# Patient Record
Sex: Female | Born: 1975
Health system: Southern US, Community
[De-identification: ages and names within clinical notes are randomized; demographics above are authoritative.]

## PROBLEM LIST (undated history)

## (undated) ENCOUNTER — Inpatient Hospital Stay (HOSPITAL_COMMUNITY): Payer: Self-pay

## (undated) DIAGNOSIS — Z789 Other specified health status: Secondary | ICD-10-CM

## (undated) DIAGNOSIS — T7840XA Allergy, unspecified, initial encounter: Secondary | ICD-10-CM

## (undated) DIAGNOSIS — Q828 Other specified congenital malformations of skin: Secondary | ICD-10-CM

## (undated) HISTORY — PX: CHOLECYSTECTOMY: SHX55

## (undated) HISTORY — PX: DILATION AND CURETTAGE OF UTERUS: SHX78

## (undated) HISTORY — DX: Allergy, unspecified, initial encounter: T78.40XA

---

## 1999-10-08 ENCOUNTER — Other Ambulatory Visit: Admission: RE | Admit: 1999-10-08 | Discharge: 1999-10-08 | Payer: Self-pay | Admitting: Gynecology

## 2001-09-18 ENCOUNTER — Emergency Department (HOSPITAL_COMMUNITY): Admission: EM | Admit: 2001-09-18 | Discharge: 2001-09-18 | Payer: Self-pay | Admitting: Emergency Medicine

## 2001-12-14 ENCOUNTER — Other Ambulatory Visit: Admission: RE | Admit: 2001-12-14 | Discharge: 2001-12-14 | Payer: Self-pay | Admitting: Obstetrics and Gynecology

## 2002-12-15 ENCOUNTER — Emergency Department (HOSPITAL_COMMUNITY): Admission: AD | Admit: 2002-12-15 | Discharge: 2002-12-15 | Payer: Self-pay | Admitting: Family Medicine

## 2003-02-24 ENCOUNTER — Encounter: Admission: RE | Admit: 2003-02-24 | Discharge: 2003-03-25 | Payer: Self-pay | Admitting: Unknown Physician Specialty

## 2003-09-25 ENCOUNTER — Emergency Department (HOSPITAL_COMMUNITY): Admission: EM | Admit: 2003-09-25 | Discharge: 2003-09-25 | Payer: Self-pay | Admitting: Family Medicine

## 2004-05-23 ENCOUNTER — Ambulatory Visit (HOSPITAL_COMMUNITY): Admission: RE | Admit: 2004-05-23 | Discharge: 2004-05-23 | Payer: Self-pay | Admitting: Obstetrics

## 2004-08-07 ENCOUNTER — Ambulatory Visit (HOSPITAL_COMMUNITY): Admission: RE | Admit: 2004-08-07 | Discharge: 2004-08-07 | Payer: Self-pay | Admitting: Obstetrics

## 2004-10-16 ENCOUNTER — Inpatient Hospital Stay (HOSPITAL_COMMUNITY): Admission: AD | Admit: 2004-10-16 | Discharge: 2004-10-17 | Payer: Self-pay | Admitting: Obstetrics & Gynecology

## 2004-12-23 ENCOUNTER — Inpatient Hospital Stay (HOSPITAL_COMMUNITY): Admission: AD | Admit: 2004-12-23 | Discharge: 2004-12-23 | Payer: Self-pay | Admitting: Obstetrics

## 2004-12-26 ENCOUNTER — Inpatient Hospital Stay (HOSPITAL_COMMUNITY): Admission: AD | Admit: 2004-12-26 | Discharge: 2004-12-26 | Payer: Self-pay | Admitting: Obstetrics

## 2004-12-26 ENCOUNTER — Inpatient Hospital Stay (HOSPITAL_COMMUNITY): Admission: AD | Admit: 2004-12-26 | Discharge: 2004-12-29 | Payer: Self-pay | Admitting: Obstetrics

## 2006-07-31 ENCOUNTER — Emergency Department (HOSPITAL_COMMUNITY): Admission: EM | Admit: 2006-07-31 | Discharge: 2006-07-31 | Payer: Self-pay | Admitting: Emergency Medicine

## 2006-10-02 ENCOUNTER — Inpatient Hospital Stay (HOSPITAL_COMMUNITY): Admission: AD | Admit: 2006-10-02 | Discharge: 2006-10-02 | Payer: Self-pay | Admitting: Obstetrics

## 2009-09-23 ENCOUNTER — Emergency Department (HOSPITAL_COMMUNITY): Admission: EM | Admit: 2009-09-23 | Discharge: 2009-09-23 | Payer: Self-pay | Admitting: Emergency Medicine

## 2010-05-04 LAB — POCT URINALYSIS DIPSTICK
Nitrite: POSITIVE — AB
Protein, ur: NEGATIVE mg/dL
Specific Gravity, Urine: >=1.03 (ref 1.005–1.030)
Urobilinogen, UA: 1 mg/dL (ref 0.0–1.0)
pH: 6.5 (ref 5.0–8.0)

## 2010-12-03 LAB — CBC
MCHC: 34.1
MCV: 83.3
RDW: 13.8
WBC: 6.3

## 2010-12-03 LAB — POCT PREGNANCY, URINE
Operator id: 13440
Preg Test, Ur: NEGATIVE

## 2010-12-03 LAB — WET PREP, GENITAL: Trich, Wet Prep: NONE SEEN

## 2010-12-03 LAB — GC/CHLAMYDIA PROBE AMP, GENITAL: GC Probe Amp, Genital: NEGATIVE

## 2010-12-03 LAB — URINALYSIS, ROUTINE W REFLEX MICROSCOPIC
Nitrite: NEGATIVE
Protein, ur: NEGATIVE
Urobilinogen, UA: 0.2

## 2010-12-03 LAB — URINE MICROSCOPIC-ADD ON

## 2011-07-19 ENCOUNTER — Emergency Department (HOSPITAL_COMMUNITY)
Admission: EM | Admit: 2011-07-19 | Discharge: 2011-07-19 | Disposition: A | Discharge status: Home / Self Care | Payer: PRIVATE HEALTH INSURANCE | Attending: Emergency Medicine | Admitting: Emergency Medicine

## 2011-07-19 ENCOUNTER — Encounter (HOSPITAL_COMMUNITY): Payer: Self-pay | Admitting: Emergency Medicine

## 2011-07-19 DIAGNOSIS — N390 Urinary tract infection, site not specified: Secondary | ICD-10-CM | POA: Insufficient documentation

## 2011-07-19 LAB — URINALYSIS, ROUTINE W REFLEX MICROSCOPIC
Bilirubin Urine: NEGATIVE
Urobilinogen, UA: 1 mg/dL (ref 0.0–1.0)
pH: 7.5 (ref 5.0–8.0)

## 2011-07-19 LAB — URINE MICROSCOPIC-ADD ON

## 2011-07-19 MED ORDER — SULFAMETHOXAZOLE-TMP DS 800-160 MG PO TABS: 1.0000 | ORAL_TABLET | Freq: Two times a day (BID) | ORAL | Status: AC

## 2011-07-19 MED ORDER — PHENAZOPYRIDINE HCL 200 MG PO TABS: 200.0000 mg | ORAL_TABLET | Freq: Two times a day (BID) | ORAL | Status: AC

## 2011-07-19 MED ORDER — SULFAMETHOXAZOLE-TMP DS 800-160 MG PO TABS
1.0000 | ORAL_TABLET | Freq: Once | ORAL | Status: AC
  Administered 2011-07-19: 1 via ORAL
  Filled 2011-07-19: qty 1

## 2011-07-19 MED ORDER — PHENAZOPYRIDINE HCL 200 MG PO TABS
200.0000 mg | ORAL_TABLET | Freq: Three times a day (TID) | ORAL | Status: DC
  Administered 2011-07-19: 200 mg via ORAL
  Filled 2011-07-19: qty 1

## 2011-07-19 NOTE — ED Notes (Signed)
Patient is alert and oriented x3.  She was given DC instructions and follow up visit instructions.  Patient gave verbal understanding. She was DC ambulatory under his own power to home.  V/S stable.  He was not showing any signs of distress on DC 

## 2011-07-19 NOTE — ED Notes (Signed)
Patient reports frequent urination and dysuria

## 2011-07-19 NOTE — ED Provider Notes (Signed)
Medical screening examination/treatment/procedure(s) were performed by non-physician practitioner and as supervising physician I was immediately available for consultation/collaboration.   Stephaine Breshears M Greer Wainright, MD 07/19/11 2244 

## 2011-07-19 NOTE — ED Provider Notes (Signed)
History     CSN: [id number]  Arrival date & time 07/19/11  0155   None     Chief Complaint  Patient presents with  . Urinary Tract Infection    (Consider location/radiation/quality/duration/timing/severity/associated sxs/prior treatment) HPI Comments: Q. the onset of urinary tract, dysuria.  Tonight.  She has a history of UTI several years ago.  Denies vaginal discharge concerned for an STD  Patient is a 36 y.o. female presenting with urinary tract infection. The history is provided by the patient.  Urinary Tract Infection This is a new problem. The current episode started today. The problem has been unchanged. Pertinent negatives include no fever, nausea or vomiting.    History reviewed. No pertinent past medical history.  Past Surgical History  Procedure Date  . Cholecystectomy     History reviewed. No pertinent family history.  History  Substance Use Topics  . Smoking status: Never Smoker   . Smokeless tobacco: Never Used  . Alcohol Use: 0.6 oz/week    1 Cans of beer per week     social    OB History    Grav Para Term Preterm Abortions TAB SAB Ect Mult Living                  Review of Systems  Constitutional: Negative for fever.  Gastrointestinal: Negative for nausea and vomiting.  Genitourinary: Positive for dysuria and frequency. Negative for flank pain, vaginal bleeding, vaginal discharge and vaginal pain.  Neurological: Negative for dizziness.    Allergies  Review of patient's allergies indicates no known allergies.  Home Medications   Current Outpatient Rx  Name Route Sig Dispense Refill  . NORGESTIMATE-ETH ESTRADIOL 0.25-35 MG-MCG PO TABS Oral Take 1 tablet by mouth daily.    Marland Kitchen PHENAZOPYRIDINE HCL 200 MG PO TABS Oral Take 1 tablet (200 mg total) by mouth 2 (two) times daily with a meal. 10 tablet 0  . SULFAMETHOXAZOLE-TMP DS 800-160 MG PO TABS Oral Take 1 tablet by mouth 2 (two) times daily. 9 tablet 0    BP 131/84  Pulse 79  Temp(Src) 98.4  F (36.9 C) (Oral)  Resp 16  SpO2 99%  LMP 07/10/2011  Physical Exam  Constitutional: She appears well-nourished.  HENT:  Head: Normocephalic.  Eyes: Pupils are equal, round, and reactive to light.  Cardiovascular: Normal rate.   Pulmonary/Chest: Effort normal.  Abdominal: She exhibits no distension. There is tenderness in the suprapubic area.  Musculoskeletal: Normal range of motion.  Neurological: She is alert.  Skin: Skin is warm.    ED Course  Procedures (including critical care time)  Labs Reviewed  URINALYSIS, ROUTINE W REFLEX MICROSCOPIC - Abnormal; Notable for the following:    APPearance CLOUDY (*)    Hgb urine dipstick LARGE (*)    Protein, ur 100 (*)    Leukocytes, UA LARGE (*)    All other components within normal limits  URINE MICROSCOPIC-ADD ON   No results found.   1. UTI (lower urinary tract infection)       MDM   Review of her urine sample.  If she does have a UTI        Arman Filter, NP 07/19/11 7829  Arman Filter, NP 07/19/11 [phone]

## 2011-07-19 NOTE — Discharge Instructions (Signed)
Urinary Tract Infection A urinary tract infection (UTI) is often caused by a germ (bacteria). A UTI is usually helped with medicine (antibiotics) that kills germs. Take all the medicine until it is gone. Do this even if you are feeling better. You are usually better in 7 to 10 days. HOME CARE   Drink enough water and fluids to keep your pee (urine) clear or pale yellow. Drink:   Cranberry juice.   Water.   Avoid:   Caffeine.   Tea.   Bubbly (carbonated) drinks.   Alcohol.   Only take medicine as told by your doctor.   To prevent further infections:   Pee often.   After pooping (bowel movement), women should wipe from front to back. Use each tissue only once.   Pee before and after having sex (intercourse).  Ask your doctor when your test results will be ready. Make sure you follow up and get your test results.  GET HELP RIGHT AWAY IF:   There is very bad back pain or lower belly (abdominal) pain.   You get the chills.   You have a fever.   Your baby is older than 3 months with a rectal temperature of 102 F (38.9 C) or higher.   Your baby is 3 months old or younger with a rectal temperature of 100.4 F (38 C) or higher.   You feel sick to your stomach (nauseous) or throw up (vomit).   There is continued burning with peeing.   Your problems are not better in 3 days. Return sooner if you are getting worse.  MAKE SURE YOU:   Understand these instructions.   Will watch your condition.   Will get help right away if you are not doing well or get worse.  Document Released: 07/24/2007 Document Revised: 01/24/2011 Document Reviewed: 07/24/2007 [employer] Patient Information 2012 [employer]. 

## 2011-10-11 ENCOUNTER — Encounter (HOSPITAL_COMMUNITY): Payer: Self-pay | Admitting: *Deleted

## 2011-10-11 ENCOUNTER — Emergency Department (INDEPENDENT_AMBULATORY_CARE_PROVIDER_SITE_OTHER)
Admission: EM | Admit: 2011-10-11 | Discharge: 2011-10-11 | Disposition: A | Discharge status: Home / Self Care | Payer: PRIVATE HEALTH INSURANCE | Source: Home / Self Care | Attending: Emergency Medicine | Admitting: Emergency Medicine

## 2011-10-11 DIAGNOSIS — L42 Pityriasis rosea: Secondary | ICD-10-CM

## 2011-10-11 MED ORDER — TRIAMCINOLONE ACETONIDE 0.025 % EX OINT: TOPICAL_OINTMENT | Freq: Two times a day (BID) | CUTANEOUS | Status: AC

## 2011-10-11 NOTE — ED Provider Notes (Signed)
History     CSN: [id number]  Arrival date & time 10/11/11  1349   First MD Initiated Contact with Patient 10/11/11 1407      Chief Complaint  Patient presents with  . Rash    (Consider location/radiation/quality/duration/timing/severity/associated sxs/prior treatment) HPI Comments: Patient presents this early afternoon to urgent care complaining of a rash in her upper torso back and her face for about 4 days. She noticed that this started on her chest area and has a bigger patch near her left breast tissue is somewhat itchy but not a lot. She suspected she might have used a new detergent. She denies any others symptoms such as fever, carotids, myalgias, unintentional weight loss or appetite changes. Describes it in the last couple days she has been observing more of this rash somewhat darker in the middle that have disseminated to her upper back and some upper arms and some on her face. No other family member has a similar rash.  Patient is a 36 y.o. female presenting with rash. The history is provided by the patient and the spouse.  Rash  This is a new problem. The current episode started 2 days ago. The problem has not changed since onset.The problem is associated with nothing. The rash is present on the torso, trunk and face. The pain is at a severity of 0/10. The patient is experiencing no pain. Associated symptoms include itching. Pertinent negatives include no pain and no weeping. She has tried nothing for the symptoms. The treatment provided no relief.    History reviewed. No pertinent past medical history.  Past Surgical History  Procedure Date  . Cholecystectomy     No family history on file.  History  Substance Use Topics  . Smoking status: Never Smoker   . Smokeless tobacco: Never Used  . Alcohol Use: 0.6 oz/week    1 Cans of beer per week     social    OB History    Grav Para Term Preterm Abortions TAB SAB Ect Mult Living                  Review of Systems    Constitutional: Negative for fever, chills, activity change, appetite change and unexpected weight change.  Skin: Positive for itching and rash. Negative for color change and wound.    Allergies  Review of patient's allergies indicates no known allergies.  Home Medications   Current Outpatient Rx  Name Route Sig Dispense Refill  . NORGESTIMATE-ETH ESTRADIOL 0.25-35 MG-MCG PO TABS Oral Take 1 tablet by mouth daily.    . TRIAMCINOLONE ACETONIDE 0.025 % EX OINT Topical Apply topically 2 (two) times daily. Apply bid x 2 weeks 30 g 0    BP 143/82  Pulse 81  Temp 98.1 F (36.7 C) (Oral)  Resp 18  SpO2 100%  LMP 10/02/2011  Physical Exam  Nursing note and vitals reviewed. Constitutional: Vital signs are normal. She appears well-developed and well-nourished.  Non-toxic appearance. She does not have a sickly appearance. She does not appear ill. No distress.  Skin: Rash noted. There is erythema.       ED Course  Procedures (including critical care time)  Labs Reviewed - No data to display No results found.   1. Pityriasis rosea       MDM  Multiple lesions the upper torso neck area and arm consistent with PPS his rosea. We discussed diagnosis and symptomatic management of mild pruritus with triamcinolone cream. Patient agreed understood discharge instructions.  Jimmie Molly, MD 10/11/11 2059

## 2011-10-11 NOTE — ED Notes (Signed)
PT  EXHIBITS  SYMPTOMS  OF  A  RASH ON TORSO  AND  BACK  SYMPTOMS  X  4  DAYS    UNRELEIVED  BY OTC  MEDS   RECENT  NEW  DETERGENT    -  NO  ANGIOEDEMA   NO  SEVERE  DISTRESS

## 2012-01-31 ENCOUNTER — Emergency Department (INDEPENDENT_AMBULATORY_CARE_PROVIDER_SITE_OTHER)
Admission: EM | Admit: 2012-01-31 | Discharge: 2012-01-31 | Disposition: A | Discharge status: Home / Self Care | Payer: Self-pay | Source: Home / Self Care

## 2012-01-31 ENCOUNTER — Encounter (HOSPITAL_COMMUNITY): Payer: Self-pay

## 2012-01-31 DIAGNOSIS — J029 Acute pharyngitis, unspecified: Secondary | ICD-10-CM

## 2012-01-31 DIAGNOSIS — J069 Acute upper respiratory infection, unspecified: Secondary | ICD-10-CM

## 2012-01-31 LAB — POCT RAPID STREP A: Streptococcus, Group A Screen (Direct): NEGATIVE

## 2012-01-31 MED ORDER — ACETAMINOPHEN 325 MG PO TABS: 650.0000 mg | ORAL_TABLET | Freq: Four times a day (QID) | ORAL | Status: DC | PRN

## 2012-01-31 MED ORDER — AMOXICILLIN 500 MG PO CAPS: 500.0000 mg | ORAL_CAPSULE | Freq: Three times a day (TID) | ORAL | Status: DC

## 2012-01-31 NOTE — ED Notes (Signed)
C/o sore throat x2days Patient states she was running a temperature today

## 2012-01-31 NOTE — ED Provider Notes (Signed)
History     CSN: [id number]  Arrival date & time 01/31/12  4098   First MD Initiated Contact with Patient 01/31/12 1849      Chief Complaint  Patient presents with  . Sore Throat    (Consider location/radiation/quality/duration/timing/severity/associated sxs/prior treatment) HPI Middle aged female here for sore throat since past 2-3 days. She informs into swallowing and fever off 100.6 yesterday. Denies any cough. Has occasional runny nose. Denies any headache or chills. Denies similar symptoms in the past. Denies any sick contacts. Denies chest pain, palpitations, shortness of breath. Denies abdominal pain bowel or urinary symptoms History reviewed. No pertinent past medical history.  Past Surgical History  Procedure Date  . Cholecystectomy     No family history on file.  History  Substance Use Topics  . Smoking status: Never Smoker   . Smokeless tobacco: Never Used  . Alcohol Use: 0.6 oz/week    1 Cans of beer per week     Comment: social    OB History    Grav Para Term Preterm Abortions TAB SAB Ect Mult Living                  Review of Systems As outlined in history of present illness Allergies  Review of patient's allergies indicates no known allergies.  Home Medications   Current Outpatient Rx  Name  Route  Sig  Dispense  Refill  . AMOXICILLIN 500 MG PO CAPS   Oral   Take 1 capsule (500 mg total) by mouth 3 (three) times daily.   30 capsule   0   . NORGESTIMATE-ETH ESTRADIOL 0.25-35 MG-MCG PO TABS   Oral   Take 1 tablet by mouth daily.         . TRIAMCINOLONE ACETONIDE 0.025 % EX OINT   Topical   Apply topically 2 (two) times daily. Apply bid x 2 weeks   30 g   0     BP 124/76  Pulse 104  Temp 100.6 F (38.1 C) (Oral)  Resp 19  SpO2 100%  Physical Exam Middle aged female in no acute distress HEENT no pallor, no icterus, moist oral mucosa, exudates over left tonsil. Tender cervical lymph node CVS: Normal S1-S2 no murmurs Chest:  Clear to this bilaterally no added sounds abdomen: Soft, nontender nondistended bowel sounds present Extremities: Warm, no edema ED Course  Procedures (including critical care time)   Labs Reviewed  RAPID STREP SCREEN   No results found.   1. Tonsillopharyngitis     i will prescribe her a course of amoxicillin 500 mg 3 times a day for 10 days. Patient advised on taking plenty of warm water, steam inhalation and salt water gargle to alleviate her symptoms. She should also take as needed Tylenol  for pain. Recommended to return to the ED or to  clinic if symptoms not improved or worsen in 24-48 hour   MDM  Follow up as needed        Eddie North, MD 01/31/12 1191

## 2012-07-12 ENCOUNTER — Other Ambulatory Visit: Payer: Self-pay | Admitting: Obstetrics

## 2012-08-13 ENCOUNTER — Encounter (HOSPITAL_COMMUNITY): Payer: Self-pay | Admitting: *Deleted

## 2012-08-13 ENCOUNTER — Emergency Department (INDEPENDENT_AMBULATORY_CARE_PROVIDER_SITE_OTHER)
Admission: EM | Admit: 2012-08-13 | Discharge: 2012-08-13 | Disposition: A | Discharge status: Home / Self Care | Payer: Self-pay | Source: Home / Self Care | Attending: Emergency Medicine | Admitting: Emergency Medicine

## 2012-08-13 DIAGNOSIS — J020 Streptococcal pharyngitis: Secondary | ICD-10-CM

## 2012-08-13 LAB — POCT RAPID STREP A: Streptococcus, Group A Screen (Direct): POSITIVE — AB

## 2012-08-13 MED ORDER — AMOXICILLIN 500 MG PO CAPS: 500.0000 mg | ORAL_CAPSULE | Freq: Three times a day (TID) | ORAL | Status: AC

## 2012-08-13 MED ORDER — ACETAMINOPHEN 325 MG PO TABS
975.0000 mg | ORAL_TABLET | Freq: Once | ORAL | Status: AC
  Administered 2012-08-13: 975 mg via ORAL

## 2012-08-13 MED ORDER — ACETAMINOPHEN 325 MG PO TABS
ORAL_TABLET | ORAL | Status: AC
  Filled 2012-08-13: qty 3

## 2012-08-13 NOTE — ED Provider Notes (Signed)
History    CSN: [id number] Arrival date & time 08/13/12  1842  First MD Initiated Contact with Patient 08/13/12 1912     Chief Complaint  Patient presents with  . Fever   (Consider location/radiation/quality/duration/timing/severity/associated sxs/prior Treatment) HPI Comments: Patient presents urgent care complaining of a sore throat and fever that started about 2 days ago. Denies any cough nasal congestion or shortness of breath. Her son has been sick with recent symptoms but mainly gastrointestinal symptoms but in the last 2 days his voice has changed and is worse now. She feels somewhat tired and with no appetite.  Patient is a 37 y.o. female presenting with fever. The history is provided by the patient.  Fever Temp source:  Tactile and oral Severity:  Moderate Onset quality:  Sudden Duration:  2 days Progression:  Worsening Associated symptoms: sore throat   Associated symptoms: no chest pain, no chills, no cough, no ear pain, no nausea, no rash, no rhinorrhea, no somnolence and no vomiting   Risk factors: no immunosuppression, no recent surgery and no sick contacts    History reviewed. No pertinent past medical history. Past Surgical History  Procedure Laterality Date  . Cholecystectomy     Family History  Problem Relation Age of Onset  . Diabetes Mother   . Hypertension Mother   . Thyroid disease Mother   . Colon cancer Father    History  Substance Use Topics  . Smoking status: Never Smoker   . Smokeless tobacco: Never Used  . Alcohol Use: 0.6 oz/week    1 Cans of beer per week     Comment: social   OB History   Grav Para Term Preterm Abortions TAB SAB Ect Mult Living                 Review of Systems  Constitutional: Positive for fever and appetite change. Negative for chills, activity change and unexpected weight change.  HENT: Positive for sore throat. Negative for ear pain, rhinorrhea, trouble swallowing and voice change.   Respiratory: Negative for  cough.   Cardiovascular: Negative for chest pain.  Gastrointestinal: Negative for nausea and vomiting.  Skin: Negative for color change, pallor and rash.    Allergies  Review of patient's allergies indicates no known allergies.  Home Medications   Current Outpatient Rx  Name  Route  Sig  Dispense  Refill  . acetaminophen (TYLENOL) 325 MG tablet   Oral   Take 2 tablets (650 mg total) by mouth every 6 (six) hours as needed for pain or fever.   30 tablet   0   . SPRINTEC 28 0.25-35 MG-MCG tablet      TAKE ONE TABLET BY MOUTH DAILY AS DIRECTED   2 Package   5   . amoxicillin (AMOXIL) 500 MG capsule   Oral   Take 1 capsule (500 mg total) by mouth 3 (three) times daily.   30 capsule   0   . amoxicillin (AMOXIL) 500 MG capsule   Oral   Take 1 capsule (500 mg total) by mouth 3 (three) times daily.   30 capsule   0   . triamcinolone (KENALOG) 0.025 % ointment   Topical   Apply topically 2 (two) times daily. Apply bid x 2 weeks   30 g   0    BP 126/74  Pulse 110  Temp(Src) 102.7 F (39.3 C) (Oral)  Resp 22  SpO2 100%  LMP 07/23/2012 Physical Exam  Vitals reviewed. Constitutional: Vital  signs are normal. She appears well-developed and well-nourished.  Non-toxic appearance. She does not have a sickly appearance. She does not appear ill.  HENT:  Head: Normocephalic.  Mouth/Throat: Uvula is midline. Oropharyngeal exudate and posterior oropharyngeal erythema present. No tonsillar abscesses.  Eyes: Conjunctivae are normal. Right eye exhibits no discharge. No scleral icterus.  Neck: Trachea normal. Neck supple.  Pulmonary/Chest: Effort normal and breath sounds normal.  Lymphadenopathy:    She has cervical adenopathy.  Skin: No erythema.    ED Course  Procedures (including critical care time) Labs Reviewed  POCT RAPID STREP A ([facility] ONLY) - Abnormal; Notable for the following:    Streptococcus, Group A Screen (Direct) POSITIVE (*)    All other components  within normal limits   No results found. 1. Strep pharyngitis     MDM  Uncomplicated streptococcal pharyngitis. Patient has been started on a course of 10 days of amoxicillin. Tylenol and ibuprofen for pain and fever management. Work note was provided for the next 48 hours and you areas of the colon are equal a always a the eyes the head was is a lymph node a small is a please is a New Prescriptions   AMOXICILLIN (AMOXIL) 500 MG CAPSULE    Take 1 capsule (500 mg total) by mouth 3 (three) times daily.    Jimmie Molly, MD 08/13/12 2006

## 2012-08-13 NOTE — ED Notes (Addendum)
Sorethroat onset Tues. and woke up last night with chills and fever.  Went to work today.  She thinks her glands are swollen and it hurts to turn her head.

## 2012-09-10 ENCOUNTER — Ambulatory Visit: Payer: Self-pay | Admitting: Obstetrics

## 2012-11-24 ENCOUNTER — Other Ambulatory Visit: Payer: Self-pay | Admitting: *Deleted

## 2012-11-24 DIAGNOSIS — B9689 Other specified bacterial agents as the cause of diseases classified elsewhere: Secondary | ICD-10-CM

## 2012-11-24 DIAGNOSIS — N760 Acute vaginitis: Secondary | ICD-10-CM

## 2012-11-24 MED ORDER — METRONIDAZOLE 500 MG PO TABS: 500.0000 mg | ORAL_TABLET | Freq: Two times a day (BID) | ORAL | Status: DC

## 2013-04-01 ENCOUNTER — Encounter: Payer: Self-pay | Admitting: Obstetrics

## 2013-04-01 ENCOUNTER — Ambulatory Visit (INDEPENDENT_AMBULATORY_CARE_PROVIDER_SITE_OTHER): Payer: Medicaid Other | Admitting: Obstetrics

## 2013-04-01 ENCOUNTER — Ambulatory Visit: Payer: Self-pay | Admitting: Obstetrics

## 2013-04-01 VITALS — BP 123/83 | HR 72 | Temp 97.9°F | Ht 63.0 in | Wt 153.0 lb

## 2013-04-01 DIAGNOSIS — Z113 Encounter for screening for infections with a predominantly sexual mode of transmission: Secondary | ICD-10-CM

## 2013-04-01 DIAGNOSIS — Z124 Encounter for screening for malignant neoplasm of cervix: Secondary | ICD-10-CM

## 2013-04-01 DIAGNOSIS — Z3009 Encounter for other general counseling and advice on contraception: Secondary | ICD-10-CM

## 2013-04-01 DIAGNOSIS — Z0000 Encounter for general adult medical examination without abnormal findings: Secondary | ICD-10-CM

## 2013-04-01 NOTE — Progress Notes (Signed)
Subjective:     Sonya Butler is a 38 y.o. female here for a routine exam.  Current complaints: Patient in office today for an annual exam.Patient states she is having some back pain. Patient states she had a TAB on February 27, 2013 Patient states she had some spotting after, then one week it was like she had a regular cycle then it was off and then came back the next week. Patient would like to talk about alternative birth control.   Personal health questionnaire reviewed: yes.   Gynecologic History No LMP recorded. Contraception: OCP (estrogen/progesterone) Last Pap: 2013. Results were: normal  Obstetric History OB History  Gravida Para Term Preterm AB SAB TAB Ectopic Multiple Living  3 2 2  1  1   2     # Outcome Date GA Lbr Len/2nd Weight Sex Delivery Anes PTL Lv  3 TRM 12/27/04 [id number]  7 lb 4 oz (3.289 kg) M SVD EPI  Y  2 TRM 05/03/92   7 lb 9 oz (3.43 kg) M SVD EPI  Y  1 TAB         N       The following portions of the patient's history were reviewed and updated as appropriate: allergies, current medications, past family history, past medical history, past social history, past surgical history and problem list.  Review of Systems Pertinent items are noted in HPI.    Objective:    General appearance: alert and no distress Breasts: normal appearance, no masses or tenderness Abdomen: normal findings: soft, non-tender Pelvic: cervix normal in appearance, external genitalia normal, no adnexal masses or tenderness, no cervical motion tenderness, rectovaginal septum normal, uterus normal size, shape, and consistency and vagina normal without discharge    Assessment:    Healthy female exam.   Contraceptive counseling.   Plan:      Nexplanon Rx.    F/U 1 year.

## 2013-04-02 LAB — WET PREP BY MOLECULAR PROBE
Candida species: NEGATIVE
Gardnerella vaginalis: POSITIVE — AB
Trichomonas vaginosis: NEGATIVE

## 2013-04-02 LAB — GC/CHLAMYDIA PROBE AMP
CT PROBE, AMP APTIMA: NEGATIVE
GC Probe RNA: NEGATIVE

## 2013-04-02 LAB — PAP IG W/ RFLX HPV ASCU

## 2013-04-08 ENCOUNTER — Encounter: Payer: Self-pay | Admitting: Obstetrics

## 2013-04-08 ENCOUNTER — Ambulatory Visit (INDEPENDENT_AMBULATORY_CARE_PROVIDER_SITE_OTHER): Payer: Medicaid Other | Admitting: Obstetrics

## 2013-04-08 VITALS — BP 126/77 | HR 87 | Temp 97.7°F | Ht 63.0 in | Wt 154.0 lb

## 2013-04-08 DIAGNOSIS — Z3202 Encounter for pregnancy test, result negative: Secondary | ICD-10-CM

## 2013-04-08 DIAGNOSIS — A499 Bacterial infection, unspecified: Secondary | ICD-10-CM

## 2013-04-08 DIAGNOSIS — B9689 Other specified bacterial agents as the cause of diseases classified elsewhere: Secondary | ICD-10-CM

## 2013-04-08 DIAGNOSIS — IMO0001 Reserved for inherently not codable concepts without codable children: Secondary | ICD-10-CM

## 2013-04-08 DIAGNOSIS — Z30017 Encounter for initial prescription of implantable subdermal contraceptive: Secondary | ICD-10-CM

## 2013-04-08 DIAGNOSIS — N760 Acute vaginitis: Secondary | ICD-10-CM

## 2013-04-08 LAB — POCT URINE PREGNANCY: Preg Test, Ur: NEGATIVE

## 2013-04-08 MED ORDER — ETONOGESTREL 68 MG ~~LOC~~ IMPL
68.0000 mg | DRUG_IMPLANT | Freq: Once | SUBCUTANEOUS | Status: AC
  Administered 2013-04-08: 68 mg via SUBCUTANEOUS

## 2013-04-08 MED ORDER — METRONIDAZOLE 500 MG PO TABS: 500.0000 mg | ORAL_TABLET | Freq: Two times a day (BID) | ORAL | Status: DC

## 2013-04-08 NOTE — Progress Notes (Addendum)
NEXPLANON INSERTION NOTE  Date of LMP:   Not asked.  Contraception used: Abstinence  Pregnancy test result: Negative  Indications:  The patient desires contraception.  She understands risks, benefits, and alternatives to Implanon and would like to proceed.  Anesthesia:   Lidocaine 1% plain.  Procedure:  A time-out was performed confirming the procedure and the patient's allergy status.  The patient's non-dominant was identified as the left arm.  The protection cap was removed. While placing countertraction on the skin, the needle was inserted at a 30 degree angle.  The applicator was held horizontal to the skin; the skin was tented upward as the needle was introduced into the subdermal space.  While holding the applicator in place, the slider was unlocked. The Nexplanon was removed from the field.  The Nexplanon was palpated to ensure proper placement.  Complications: None  Instructions:  The patient was instructed to remove the dressing in 24 hours and that some bruising is to be expected.  She was advised to use over the counter analgesics as needed for any pain at the site.  She is to keep the area dry for 24 hours and to call if her hand or arm becomes cold, numb, or blue.  Return visit:  Return in 2 weeks  EXP:  07 / 2017 Lot # :  [id number] / [id number]

## 2013-04-09 ENCOUNTER — Encounter: Payer: Self-pay | Admitting: Obstetrics

## 2013-04-22 ENCOUNTER — Encounter: Payer: Self-pay | Admitting: Obstetrics

## 2013-04-22 ENCOUNTER — Ambulatory Visit (INDEPENDENT_AMBULATORY_CARE_PROVIDER_SITE_OTHER): Payer: Medicaid Other | Admitting: Obstetrics

## 2013-04-22 VITALS — BP 125/82 | HR 73 | Wt 148.0 lb

## 2013-04-22 DIAGNOSIS — Z3046 Encounter for surveillance of implantable subdermal contraceptive: Secondary | ICD-10-CM

## 2013-04-22 NOTE — Progress Notes (Signed)
Subjective:     [name] is a 38 y.o. female here for a follow up Nexplanon insertion exam.  Current complaints: pt states that incision site is a little itchy but has no other complaints..  Personal health questionnaire reviewed: yes.   Gynecologic History No LMP recorded. Contraception: Nexplanon   Obstetric History OB History  Gravida Para Term Preterm AB SAB TAB Ectopic Multiple Living  3 2 2  1  1   2     # Outcome Date GA Lbr Len/2nd Weight Sex Delivery Anes PTL Lv  3 TAB 2015        N  2 TRM 12/27/04 [id number]  7 lb 4 oz (3.289 kg) M SVD EPI  Y  1 TRM 05/03/92   7 lb 9 oz (3.43 kg) M SVD EPI  Y       The following portions of the patient's history were reviewed and updated as appropriate: allergies, current medications, past family history, past medical history, past social history, past surgical history and problem list.  Review of Systems Pertinent items are noted in HPI.    Objective:     LUE:  Nexplanon rod palpated.  Intact and nontender to palpation.     Assessment:    Nexplanon surveillance.  Doing well.   Plan:    Education reviewed: safe sex/STD prevention. Contraception: Nexplanon. Annual next visit.

## 2013-07-26 ENCOUNTER — Other Ambulatory Visit: Payer: Self-pay | Admitting: *Deleted

## 2013-07-26 DIAGNOSIS — B379 Candidiasis, unspecified: Secondary | ICD-10-CM

## 2013-07-26 MED ORDER — FLUCONAZOLE 150 MG PO TABS: 150.0000 mg | ORAL_TABLET | Freq: Once | ORAL | Status: DC

## 2013-07-27 ENCOUNTER — Telehealth: Payer: Self-pay | Admitting: *Deleted

## 2013-07-27 NOTE — Telephone Encounter (Signed)
Patient called wanting a prescription change. Patient states she took medication for her BV and that it has been 3 days since finishing the antibiotic. Patient states she is still having symptoms, a thick white discharge and a little vaginal odor. Patient advised that it sounds like she has a yeast infection and per Nursing protocol we would send Diflucan to the Pharmacy. Patient advised to give the medication a week and that if symptoms had not improved to call us back and we would get her in for an appointment.

## 2013-07-30 ENCOUNTER — Telehealth: Payer: Self-pay | Admitting: *Deleted

## 2013-07-30 NOTE — Telephone Encounter (Signed)
Patient states she has been treated with FLagyl and Diflucan and is still having vaginal discharge. Patient scheduled an appointment for 08/03/13 at 1:45p.

## 2013-08-03 ENCOUNTER — Ambulatory Visit (INDEPENDENT_AMBULATORY_CARE_PROVIDER_SITE_OTHER): Payer: Medicaid Other | Admitting: Advanced Practice Midwife

## 2013-08-03 ENCOUNTER — Ambulatory Visit: Payer: Self-pay | Admitting: Obstetrics

## 2013-08-03 ENCOUNTER — Encounter: Payer: Self-pay | Admitting: Obstetrics

## 2013-08-03 VITALS — BP 126/80 | HR 87 | Temp 98.4°F | Ht 63.0 in | Wt 148.0 lb

## 2013-08-03 DIAGNOSIS — N760 Acute vaginitis: Secondary | ICD-10-CM

## 2013-08-03 DIAGNOSIS — A499 Bacterial infection, unspecified: Secondary | ICD-10-CM

## 2013-08-03 DIAGNOSIS — B9689 Other specified bacterial agents as the cause of diseases classified elsewhere: Secondary | ICD-10-CM

## 2013-08-03 MED ORDER — METRONIDAZOLE 500 MG PO TABS: 500.0000 mg | ORAL_TABLET | Freq: Two times a day (BID) | ORAL | Status: DC

## 2013-08-03 NOTE — Progress Notes (Signed)
  Subjective:    Sonya Butler is a 38 y.o. female who presents for vaginal discharge and sexually transmitted disease check. Sexual history reviewed with the patient. STI Exposure: denies knowledge of risky exposure. Previous history of STI not mentioned. Current symptoms vaginal discharge: malodorous. Patient believes she has +BV, reports she gets 3-4 infections annual approx. States she did recently douche due to the odor.  Contraception: Nexplanon Menstrual History: OB History   Grav Para Term Preterm Abortions TAB SAB Ect Mult Living   3 2 2  1 1    2       Menarche age: [age]  Patient's last menstrual period was 07/29/2013.    The following portions of the patient's history were reviewed and updated as appropriate: allergies, current medications, past family history, past medical history, past social history, past surgical history and problem list.  Review of Systems Constitutional: negative for fatigue and weight loss Respiratory: negative for cough and wheezing Cardiovascular: negative for chest pain, fatigue and palpitations Gastrointestinal: negative for abdominal pain and change in bowel habits Genitourinary:vaginal discharge w/ odor Integument/breast: negative for nipple discharge Musculoskeletal:negative for myalgias Neurological: negative for gait problems and tremors Behavioral/Psych: negative for abusive relationship, depression Endocrine: negative for temperature intolerance       Objective:    BP 126/80  Pulse 87  Temp(Src) 98.4 F (36.9 C)  Ht 5\' 3"  (1.6 m)  Wt 148 lb (67.132 kg)  BMI 26.22 kg/m2  LMP 07/29/2013 General:   alert and cooperative  Lymph Nodes:   Cervical, supraclavicular, and axillary nodes normal.  Pelvis:  Vulva and vagina appear normal. Bimanual exam reveals normal uterus and adnexa. Vaginal: discharge, +whiff  Cultures:  GC and Chlamydia genprobes and affirm     Assessment:    Very low risk of STD exposure Bacterial Vaginosis  (recurrent)      Plan:    Discussed safe sexual practice in detail Flagyl at this time, if patient has another infection consider long term management Encouraged probiotic, and discussed methods to reduce risk of reoccurrence. Patient declined full STI screen aware it is recommended when getting a STI screen. Orders Placed This Encounter  Procedures  . WET PREP BY MOLECULAR PROBE  . GC/Chlamydia Probe Amp   Meds ordered this encounter  Medications  . metroNIDAZOLE (FLAGYL) 500 MG tablet    Sig: Take 1 tablet (500 mg total) by mouth 2 (two) times daily.    Dispense:  14 tablet    Refill:  0    Order Specific Question:  Supervising Provider    Answer:  Coral CeoHARPER, CHARLES A [[id number]     Dory HornAmy Wren CNM

## 2013-08-04 LAB — GC/CHLAMYDIA PROBE AMP
CT Probe RNA: NEGATIVE
GC Probe RNA: NEGATIVE

## 2013-08-04 LAB — WET PREP BY MOLECULAR PROBE
Candida species: NEGATIVE
Gardnerella vaginalis: POSITIVE — AB
TRICHOMONAS VAG: NEGATIVE

## 2013-08-22 ENCOUNTER — Inpatient Hospital Stay (HOSPITAL_COMMUNITY)
Admission: AD | Admit: 2013-08-22 | Discharge: 2013-08-22 | Disposition: A | Discharge status: Home / Self Care | Payer: Medicaid Other | Source: Ambulatory Visit | Attending: Obstetrics | Admitting: Obstetrics

## 2013-08-22 ENCOUNTER — Encounter (HOSPITAL_COMMUNITY): Payer: Self-pay

## 2013-08-22 DIAGNOSIS — B3731 Acute candidiasis of vulva and vagina: Secondary | ICD-10-CM | POA: Diagnosis not present

## 2013-08-22 DIAGNOSIS — B373 Candidiasis of vulva and vagina: Secondary | ICD-10-CM | POA: Diagnosis not present

## 2013-08-22 DIAGNOSIS — R300 Dysuria: Secondary | ICD-10-CM | POA: Diagnosis present

## 2013-08-22 HISTORY — DX: Other specified health status: Z78.9

## 2013-08-22 LAB — URINALYSIS, ROUTINE W REFLEX MICROSCOPIC
BILIRUBIN URINE: NEGATIVE
GLUCOSE, UA: NEGATIVE mg/dL
KETONES UR: NEGATIVE mg/dL
Nitrite: NEGATIVE
PH: 5.5 (ref 5.0–8.0)
Protein, ur: NEGATIVE mg/dL
Specific Gravity, Urine: <1.005 — ABNORMAL LOW (ref 1.005–1.030)
Urobilinogen, UA: 0.2 mg/dL (ref 0.0–1.0)

## 2013-08-22 LAB — URINE MICROSCOPIC-ADD ON

## 2013-08-22 LAB — WET PREP, GENITAL
CLUE CELLS WET PREP: NONE SEEN
Trich, Wet Prep: NONE SEEN
Yeast Wet Prep HPF POC: NONE SEEN

## 2013-08-22 LAB — POCT PREGNANCY, URINE: Preg Test, Ur: NEGATIVE

## 2013-08-22 MED ORDER — FLUCONAZOLE 150 MG PO TABS
150.0000 mg | ORAL_TABLET | Freq: Once | ORAL | Status: AC
  Administered 2013-08-22: 150 mg via ORAL
  Filled 2013-08-22: qty 1

## 2013-08-22 NOTE — MAU Provider Note (Signed)
Attestation of Attending Supervision of Advanced Practitioner (CNM/NP): Evaluation and management procedures were performed by the Advanced Practitioner under my supervision and collaboration.  I have reviewed the Advanced Practitioner's note and chart, and I agree with the management and plan.  Gudelia Eugene 08/22/2013 8:34 PM   

## 2013-08-22 NOTE — Discharge Instructions (Signed)

## 2013-08-22 NOTE — MAU Provider Note (Signed)
History     CSN: [id number]  Arrival date and time: 08/22/13 1305   First Provider Initiated Contact with Patient 08/22/13 1519      Chief Complaint  Patient presents with  . Urinary Tract Infection  . Abdominal Pain   Urinary Tract Infection   Abdominal Pain    Sonya Butler is a 38 y.o. [id number] who presents today with pain and pressure when urinating. She states that this started this morning. She denies any vaginal discharge.   Past Medical History  Diagnosis Date  . Medical history non-contributory     Past Surgical History  Procedure Laterality Date  . Cholecystectomy    . Dilation and curettage of uterus      Family History  Problem Relation Age of Onset  . Diabetes Mother   . Hypertension Mother   . Thyroid disease Mother   . Colon cancer Father     History  Substance Use Topics  . Smoking status: Never Smoker   . Smokeless tobacco: Never Used  . Alcohol Use: No     Comment: social    Allergies: No Known Allergies  No prescriptions prior to admission    Review of Systems  Gastrointestinal: Positive for abdominal pain.   Physical Exam   Blood pressure 118/68, pulse 82, temperature 98.3 F (36.8 C), temperature source Oral, resp. rate 16, height 5\' 3"  (1.6 m), weight 67.132 kg (148 lb), last menstrual period 07/29/2013.  Physical Exam  Nursing note and vitals reviewed. Constitutional: She is oriented to person, place, and time. She appears well-developed and well-nourished. No distress.  Cardiovascular: Normal rate.   Respiratory: Effort normal.  GI: Soft. There is no tenderness.  Genitourinary:   External: no lesion Vagina: small amount of thick white discharge Cervix: pink, smooth, no CMT Uterus: NSSC Adnexa: NT   Neurological: She is alert and oriented to person, place, and time.  Skin: Skin is warm and dry.  Psychiatric: She has a normal mood and affect.    MAU Course  Procedures  Results for orders placed during the  hospital encounter of 08/22/13 (from the past 24 hour(s))  URINALYSIS, ROUTINE W REFLEX MICROSCOPIC     Status: Abnormal   Collection Time    08/22/13  2:30 PM      Result Value Ref Range   Color, Urine YELLOW  YELLOW   APPearance CLEAR  CLEAR   Specific Gravity, Urine <1.005 (*) 1.005 - 1.030   pH 5.5  5.0 - 8.0   Glucose, UA NEGATIVE  NEGATIVE mg/dL   Hgb urine dipstick TRACE (*) NEGATIVE   Bilirubin Urine NEGATIVE  NEGATIVE   Ketones, ur NEGATIVE  NEGATIVE mg/dL   Protein, ur NEGATIVE  NEGATIVE mg/dL   Urobilinogen, UA 0.2  0.0 - 1.0 mg/dL   Nitrite NEGATIVE  NEGATIVE   Leukocytes, UA SMALL (*) NEGATIVE  URINE MICROSCOPIC-ADD ON     Status: Abnormal   Collection Time    08/22/13  2:30 PM      Result Value Ref Range   Squamous Epithelial / LPF FEW (*) RARE   WBC, UA 7-10  <3 WBC/hpf   RBC / HPF 0-2  <3 RBC/hpf   Bacteria, UA FEW (*) RARE  POCT PREGNANCY, URINE     Status: None   Collection Time    08/22/13  2:36 PM      Result Value Ref Range   Preg Test, Ur NEGATIVE  NEGATIVE  WET PREP, GENITAL  Status: Abnormal   Collection Time    08/22/13  3:26 PM      Result Value Ref Range   Yeast Wet Prep HPF POC NONE SEEN  NONE SEEN   Trich, Wet Prep NONE SEEN  NONE SEEN   Clue Cells Wet Prep HPF POC NONE SEEN  NONE SEEN   WBC, Wet Prep HPF POC FEW (*) NONE SEEN    Assessment and Plan   1. Yeast infection involving the vagina and surrounding area    GC/CT pending UC pending Treated with diflucan here in MAU  Return to MAU as needed   Follow-up Information   [facility] [place]. (As needed)    Contact information:   [address] [place] [address] [phone]       Tawnya CrookHogan, Heather Donovan 08/22/2013, 3:29 PM

## 2013-08-22 NOTE — MAU Note (Signed)
Pt states here for urgency with voiding and suprapubic pain since this am. Denies bleeding or vag d/c changes.

## 2013-08-23 LAB — GC/CHLAMYDIA PROBE AMP
CT Probe RNA: NEGATIVE
GC PROBE AMP APTIMA: NEGATIVE

## 2013-08-23 LAB — URINE CULTURE

## 2013-09-07 ENCOUNTER — Ambulatory Visit (INDEPENDENT_AMBULATORY_CARE_PROVIDER_SITE_OTHER): Payer: Medicaid Other | Admitting: Obstetrics

## 2013-09-07 ENCOUNTER — Encounter: Payer: Self-pay | Admitting: Obstetrics

## 2013-09-07 VITALS — BP 121/82 | HR 83 | Temp 97.9°F | Ht 63.0 in | Wt 149.0 lb

## 2013-09-07 DIAGNOSIS — R350 Frequency of micturition: Secondary | ICD-10-CM

## 2013-09-07 DIAGNOSIS — R300 Dysuria: Secondary | ICD-10-CM

## 2013-09-08 LAB — URINE CULTURE

## 2013-09-09 ENCOUNTER — Encounter: Payer: Self-pay | Admitting: Obstetrics

## 2013-09-09 NOTE — Progress Notes (Signed)
Patient ID: Sonya Butler, female   DOB: [birth year], 38 y.o.   MRN: [id number]  Chief Complaint  Patient presents with  . Problem    Possible UTI     HPI Sonya Butler is a 38 y.o. female.  Urinary frequency and burning.  HPI  Past Medical History  Diagnosis Date  . Medical history non-contributory     Past Surgical History  Procedure Laterality Date  . Cholecystectomy    . Dilation and curettage of uterus      Family History  Problem Relation Age of Onset  . Diabetes Mother   . Hypertension Mother   . Thyroid disease Mother   . Colon cancer Father     Social History History  Substance Use Topics  . Smoking status: Never Smoker   . Smokeless tobacco: Never Used  . Alcohol Use: No     Comment: social    No Known Allergies  No current outpatient prescriptions on file.   No current facility-administered medications for this visit.    Review of Systems Review of Systems Constitutional: negative for fatigue and weight loss Respiratory: negative for cough and wheezing Cardiovascular: negative for chest pain, fatigue and palpitations Gastrointestinal: negative for abdominal pain and change in bowel habits Genitourinary:negative Integument/breast: negative for nipple discharge Musculoskeletal:negative for myalgias Neurological: negative for gait problems and tremors Behavioral/Psych: negative for abusive relationship, depression Endocrine: negative for temperature intolerance     Blood pressure 121/82, pulse 83, temperature 97.9 F (36.6 C), height 5\' 3"  (1.6 m), weight 149 lb (67.586 kg), last menstrual period 08/31/2013.  Physical Exam Physical Exam General:   alert  Skin:   no rash or abnormalities  Lungs:   clear to auscultation bilaterally  Heart:   regular rate and rhythm, S1, S2 normal, no murmur, click, rub or gallop  Breasts:   normal without suspicious masses, skin or nipple changes or axillary nodes  Abdomen:  normal findings: no  organomegaly, soft, non-tender and no hernia  Pelvis:  External genitalia: normal general appearance Urinary system: urethral meatus normal and bladder without fullness, nontender Vaginal: normal without tenderness, induration or masses Cervix: normal appearance Adnexa: normal bimanual exam Uterus: anteverted and non-tender, normal size    100% of 10 min visit spent on counseling and coordination of care.   Data Reviewed Labs  Assessment    Dysuria and frequency.  Negative urinalysis.     Plan    Urine culture sent.  Orders Placed This Encounter  Procedures  . Urine Culture   No orders of the defined types were placed in this encounter.        HARPER,CHARLES A 09/09/2013, 5:04 AM

## 2013-10-21 ENCOUNTER — Telehealth: Payer: Self-pay | Admitting: *Deleted

## 2013-10-21 DIAGNOSIS — N760 Acute vaginitis: Secondary | ICD-10-CM

## 2013-10-21 DIAGNOSIS — B9689 Other specified bacterial agents as the cause of diseases classified elsewhere: Secondary | ICD-10-CM

## 2013-10-21 MED ORDER — METRONIDAZOLE 500 MG PO TABS: 500.0000 mg | ORAL_TABLET | Freq: Two times a day (BID) | ORAL | Status: DC

## 2013-10-21 NOTE — Telephone Encounter (Signed)
Patient called stating she needs a Rx for BV.   CB: 6:44pm, Patient states she is having a thick white discharge and vaginal odor. Per nursing protocol Metronidazole sent to pharmacy. Patient notified that she will take medication twice a day for seven days and that if her symptoms do not improve to call us back.

## 2013-12-07 ENCOUNTER — Telehealth: Payer: Self-pay | Admitting: *Deleted

## 2013-12-07 NOTE — Telephone Encounter (Signed)
Patient states she is having a discharge with an odor. Patient denies any itching or burning. Patient requesting a prescription for BV. Patient was treated last month for a bacterial infection.   Does patient need an appointment?

## 2013-12-08 ENCOUNTER — Other Ambulatory Visit: Payer: Self-pay | Admitting: *Deleted

## 2013-12-08 DIAGNOSIS — N760 Acute vaginitis: Principal | ICD-10-CM

## 2013-12-08 DIAGNOSIS — B9689 Other specified bacterial agents as the cause of diseases classified elsewhere: Secondary | ICD-10-CM

## 2013-12-08 MED ORDER — METRONIDAZOLE 500 MG PO TABS: 500.0000 mg | ORAL_TABLET | Freq: Two times a day (BID) | ORAL | Status: DC

## 2013-12-08 NOTE — Telephone Encounter (Signed)
No appointment needed.  OK to send Rx.

## 2013-12-08 NOTE — Telephone Encounter (Signed)
Rx sent to pharmacy- patient notified per VM 

## 2013-12-20 ENCOUNTER — Encounter: Payer: Self-pay | Admitting: Obstetrics

## 2014-01-08 ENCOUNTER — Encounter (HOSPITAL_COMMUNITY): Payer: Self-pay | Admitting: *Deleted

## 2014-01-08 ENCOUNTER — Inpatient Hospital Stay (HOSPITAL_COMMUNITY)
Admission: AD | Admit: 2014-01-08 | Discharge: 2014-01-08 | Disposition: A | Discharge status: Home / Self Care | Payer: Medicaid Other | Source: Ambulatory Visit | Attending: Obstetrics | Admitting: Obstetrics

## 2014-01-08 DIAGNOSIS — B9689 Other specified bacterial agents as the cause of diseases classified elsewhere: Secondary | ICD-10-CM | POA: Insufficient documentation

## 2014-01-08 DIAGNOSIS — N760 Acute vaginitis: Secondary | ICD-10-CM | POA: Diagnosis not present

## 2014-01-08 DIAGNOSIS — A499 Bacterial infection, unspecified: Secondary | ICD-10-CM

## 2014-01-08 DIAGNOSIS — N898 Other specified noninflammatory disorders of vagina: Secondary | ICD-10-CM | POA: Diagnosis present

## 2014-01-08 LAB — WET PREP, GENITAL
Trich, Wet Prep: NONE SEEN
YEAST WET PREP: NONE SEEN

## 2014-01-08 LAB — URINALYSIS, ROUTINE W REFLEX MICROSCOPIC
BILIRUBIN URINE: NEGATIVE
Glucose, UA: NEGATIVE mg/dL
Hgb urine dipstick: NEGATIVE
KETONES UR: NEGATIVE mg/dL
Leukocytes, UA: NEGATIVE
Nitrite: NEGATIVE
PH: 7 (ref 5.0–8.0)
Protein, ur: NEGATIVE mg/dL
SPECIFIC GRAVITY, URINE: 1.015 (ref 1.005–1.030)
Urobilinogen, UA: 0.2 mg/dL (ref 0.0–1.0)

## 2014-01-08 LAB — POCT PREGNANCY, URINE: Preg Test, Ur: NEGATIVE

## 2014-01-08 MED ORDER — METRONIDAZOLE 500 MG PO TABS: 500.0000 mg | ORAL_TABLET | Freq: Two times a day (BID) | ORAL | Status: DC

## 2014-01-08 NOTE — Discharge Instructions (Signed)
Bacterial Vaginosis Bacterial vaginosis is a vaginal infection that occurs when the normal balance of bacteria in the vagina is disrupted. It results from an overgrowth of certain bacteria. This is the most common vaginal infection in women of childbearing age. Treatment is important to prevent complications, especially in pregnant women, as it can cause a premature delivery. CAUSES  Bacterial vaginosis is caused by an increase in harmful bacteria that are normally present in smaller amounts in the vagina. Several different kinds of bacteria can cause bacterial vaginosis. However, the reason that the condition develops is not fully understood. RISK FACTORS Certain activities or behaviors can put you at an increased risk of developing bacterial vaginosis, including:  Having a new sex partner or multiple sex partners.  Douching.  Using an intrauterine device (IUD) for contraception. Women do not get bacterial vaginosis from toilet seats, bedding, swimming pools, or contact with objects around them. SIGNS AND SYMPTOMS  Some women with bacterial vaginosis have no signs or symptoms. Common symptoms include:  Grey vaginal discharge.  A fishlike odor with discharge, especially after sexual intercourse.  Itching or burning of the vagina and vulva.  Burning or pain with urination. DIAGNOSIS  Your health care provider will take a medical history and examine the vagina for signs of bacterial vaginosis. A sample of vaginal fluid may be taken. Your health care provider will look at this sample under a microscope to check for bacteria and abnormal cells. A vaginal pH test may also be done.  TREATMENT  Bacterial vaginosis may be treated with antibiotic medicines. These may be given in the form of a pill or a vaginal cream. A second round of antibiotics may be prescribed if the condition comes back after treatment.  HOME CARE INSTRUCTIONS   Only take over-the-counter or prescription medicines as  directed by your health care provider.  If antibiotic medicine was prescribed, take it as directed. Make sure you finish it even if you start to feel better.  Do not have sex until treatment is completed.  Tell all sexual partners that you have a vaginal infection. They should see their health care provider and be treated if they have problems, such as a mild rash or itching.  Practice safe sex by using condoms and only having one sex partner. SEEK MEDICAL CARE IF:   Your symptoms are not improving after 3 days of treatment.  You have increased discharge or pain.  You have a fever. MAKE SURE YOU:   Understand these instructions.  Will watch your condition.  Will get help right away if you are not doing well or get worse. FOR MORE INFORMATION  Centers for Disease Control and Prevention, Division of STD Prevention: www.cdc.gov/std American Sexual Health Association (ASHA): www.ashastd.org  Document Released: 02/04/2005 Document Revised: 11/25/2012 Document Reviewed: 09/16/2012 ExitCare Patient Information 2015 [employer]. This information is not intended to replace advice given to you by your health care provider. Make sure you discuss any questions you have with your health care provider.  

## 2014-01-08 NOTE — MAU Provider Note (Signed)
History     CSN: [id number]  Arrival date and time: 01/08/14 2004   None     Chief Complaint  Patient presents with  . Vaginitis   HPI  Sonya Butler is a 38 y.o. female who presents with vaginal discharge with an odor. No new sexual partners. She was prescribed metronidazole for bacterial vaginosis a few weeks ago and took it as prescribed. She feels that she is getting BV every three months and feels that she has it now.    OB History    Gravida Para Term Preterm AB TAB SAB Ectopic Multiple Living   3 2 2  1 1    2       Past Medical History  Diagnosis Date  . Medical history non-contributory     Past Surgical History  Procedure Laterality Date  . Cholecystectomy    . Dilation and curettage of uterus      Family History  Problem Relation Age of Onset  . Diabetes Mother   . Hypertension Mother   . Thyroid disease Mother   . Colon cancer Father     History  Substance Use Topics  . Smoking status: Never Smoker   . Smokeless tobacco: Never Used  . Alcohol Use: No     Comment: social    Allergies: No Known Allergies  Prescriptions prior to admission  Medication Sig Dispense Refill Last Dose  . metroNIDAZOLE (FLAGYL) 500 MG tablet Take 1 tablet (500 mg total) by mouth 2 (two) times daily. 14 tablet 0    Results for orders placed or performed during the hospital encounter of 01/08/14 (from the past 48 hour(s))  Urinalysis, Routine w reflex microscopic     Status: None   Collection Time: 01/08/14  8:10 PM  Result Value Ref Range   Color, Urine YELLOW YELLOW   APPearance CLEAR CLEAR   Specific Gravity, Urine 1.015 1.005 - 1.030   pH 7.0 5.0 - 8.0   Glucose, UA NEGATIVE NEGATIVE mg/dL   Hgb urine dipstick NEGATIVE NEGATIVE   Bilirubin Urine NEGATIVE NEGATIVE   Ketones, ur NEGATIVE NEGATIVE mg/dL   Protein, ur NEGATIVE NEGATIVE mg/dL   Urobilinogen, UA 0.2 0.0 - 1.0 mg/dL   Nitrite NEGATIVE NEGATIVE   Leukocytes, UA NEGATIVE NEGATIVE    Comment:  MICROSCOPIC NOT DONE ON URINES WITH NEGATIVE PROTEIN, BLOOD, LEUKOCYTES, NITRITE, OR GLUCOSE <1000 mg/dL.  Pregnancy, urine POC     Status: None   Collection Time: 01/08/14  8:17 PM  Result Value Ref Range   Preg Test, Ur NEGATIVE NEGATIVE    Comment:        THE SENSITIVITY OF THIS METHODOLOGY IS >24 mIU/mL   Wet prep, genital     Status: Abnormal   Collection Time: 01/08/14  8:50 PM  Result Value Ref Range   Yeast Wet Prep HPF POC NONE SEEN NONE SEEN   Trich, Wet Prep NONE SEEN NONE SEEN   Clue Cells Wet Prep HPF POC MODERATE (A) NONE SEEN   WBC, Wet Prep HPF POC FEW (A) NONE SEEN    Comment: MANY BACTERIA SEEN    Review of Systems  Gastrointestinal: Negative for abdominal pain.  Genitourinary: Negative for dysuria, urgency, frequency, hematuria and flank pain.  Musculoskeletal: Negative for back pain.   Physical Exam   Blood pressure 131/79, pulse 78, temperature 98.2 F (36.8 C), temperature source Oral, resp. rate 16, height 5\' 3"  (1.6 m), weight 68.663 kg (151 lb 6 oz), last menstrual period 12/26/2013.  Physical Exam  Constitutional: She is oriented to person, place, and time. She appears well-developed and well-nourished. No distress.  HENT:  Head: Normocephalic.  Eyes: Pupils are equal, round, and reactive to light.  Neck: Neck supple.  Respiratory: Effort normal.  GI: Soft.  Genitourinary:  Speculum exam: Vagina - Moderate amount of creamy discharge, Strong odor Cervix - No contact bleeding GC/Chlam, wet prep done Chaperone present for exam.   Neurological: She is alert and oriented to person, place, and time.  Skin: Skin is warm. She is not diaphoretic.  Psychiatric: Her behavior is normal.    MAU Course  Procedures  None  MDM Wet prep GC  Assessment and Plan   A:  1. BV (bacterial vaginosis)     P:  Discharge home in stable condition Return to MAU for emergencies RX: Flagyl- no alcohol Contact PCP to discuss recurring BV    Sonya HansenJennifer  Irene Vaniyah Lansky, NP 01/09/2014 12:03 AM

## 2014-01-08 NOTE — MAU Note (Signed)
Patient presents with complaint of vaginitis X 1 week.

## 2014-01-11 ENCOUNTER — Telehealth: Payer: Self-pay | Admitting: *Deleted

## 2014-01-11 ENCOUNTER — Ambulatory Visit (INDEPENDENT_AMBULATORY_CARE_PROVIDER_SITE_OTHER): Payer: Medicaid Other | Admitting: Obstetrics

## 2014-01-11 ENCOUNTER — Encounter: Payer: Self-pay | Admitting: Obstetrics

## 2014-01-11 VITALS — BP 132/86 | HR 97 | Temp 98.2°F | Ht 64.0 in | Wt 150.0 lb

## 2014-01-11 DIAGNOSIS — A5403 Gonococcal cervicitis, unspecified: Secondary | ICD-10-CM

## 2014-01-11 LAB — GC/CHLAMYDIA PROBE AMP
CT Probe RNA: NEGATIVE
GC PROBE AMP APTIMA: POSITIVE — AB

## 2014-01-11 MED ORDER — CEFTRIAXONE SODIUM 1 G IJ SOLR
250.0000 mg | Freq: Once | INTRAMUSCULAR | Status: AC
  Administered 2014-01-11: 250 mg via INTRAMUSCULAR

## 2014-01-11 MED ORDER — AZITHROMYCIN 1 G PO PACK: 1.0000 g | PACK | Freq: Once | ORAL | Status: DC

## 2014-01-11 MED ORDER — CEFTRIAXONE SODIUM 250 MG IJ SOLR: 250.0000 mg | Freq: Once | INTRAMUSCULAR | Status: DC

## 2014-01-11 NOTE — Progress Notes (Signed)
[facility]  A/P:  GC cervicitis.  Rocephin given IM.  Coral Ceoharles Savon Bordonaro MD

## 2014-01-11 NOTE — Progress Notes (Signed)
Patient was notified per the hospital that she tested positive for GC. Patient came to office for treatment. Patient advised to follow up for TOC in 2-3 months.

## 2014-01-11 NOTE — Telephone Encounter (Signed)
Patient states she was [facility]. She is calling for an appointment for treatment. 10:08 Call to patient she will be at office at 4:00  Today. Treat with Rocephin and Zithromax per Dr Clearance CootsHarper.

## 2014-01-17 ENCOUNTER — Ambulatory Visit: Payer: Medicaid Other | Admitting: Obstetrics

## 2014-02-07 ENCOUNTER — Telehealth: Payer: Self-pay | Admitting: *Deleted

## 2014-02-07 DIAGNOSIS — B9689 Other specified bacterial agents as the cause of diseases classified elsewhere: Secondary | ICD-10-CM

## 2014-02-07 DIAGNOSIS — N760 Acute vaginitis: Secondary | ICD-10-CM

## 2014-02-07 MED ORDER — METRONIDAZOLE 500 MG PO TABS: 500.0000 mg | ORAL_TABLET | Freq: Two times a day (BID) | ORAL | Status: DC

## 2014-02-07 NOTE — Telephone Encounter (Signed)
Patient states she is having symptoms of BV. Patient advised that we have treated her frequently for this. Going to send in prescription this time but if she has a reoccurrence then we are going to need to have her schedule an appointment.

## 2014-02-21 ENCOUNTER — Telehealth: Payer: Self-pay | Admitting: *Deleted

## 2014-02-21 NOTE — Telephone Encounter (Signed)
Patient requesting an appointment for a Nexplanon Removal. Patient states she is having bleeding on the Nexplanon and just wants it out. Patient advised that Dr. Clearance Coots likes to do a consultation prior to removing the Nexplanon. Patient verbalized understanding and given an appointment for 03-10-14.

## 2014-03-10 ENCOUNTER — Encounter: Payer: Self-pay | Admitting: Obstetrics

## 2014-03-10 ENCOUNTER — Ambulatory Visit (INDEPENDENT_AMBULATORY_CARE_PROVIDER_SITE_OTHER): Payer: Medicaid Other | Admitting: Obstetrics

## 2014-03-10 VITALS — BP 118/83 | HR 75 | Temp 97.8°F | Ht 63.0 in | Wt 150.0 lb

## 2014-03-10 DIAGNOSIS — B3731 Acute candidiasis of vulva and vagina: Secondary | ICD-10-CM

## 2014-03-10 DIAGNOSIS — N939 Abnormal uterine and vaginal bleeding, unspecified: Secondary | ICD-10-CM

## 2014-03-10 DIAGNOSIS — A499 Bacterial infection, unspecified: Secondary | ICD-10-CM

## 2014-03-10 DIAGNOSIS — N898 Other specified noninflammatory disorders of vagina: Secondary | ICD-10-CM

## 2014-03-10 DIAGNOSIS — N760 Acute vaginitis: Secondary | ICD-10-CM

## 2014-03-10 DIAGNOSIS — B373 Candidiasis of vulva and vagina: Secondary | ICD-10-CM

## 2014-03-10 DIAGNOSIS — Z975 Presence of (intrauterine) contraceptive device: Secondary | ICD-10-CM

## 2014-03-10 DIAGNOSIS — B9689 Other specified bacterial agents as the cause of diseases classified elsewhere: Secondary | ICD-10-CM

## 2014-03-10 MED ORDER — METRONIDAZOLE 500 MG PO TABS: 500.0000 mg | ORAL_TABLET | Freq: Two times a day (BID) | ORAL | Status: DC

## 2014-03-10 MED ORDER — FLUCONAZOLE 150 MG PO TABS: 150.0000 mg | ORAL_TABLET | Freq: Once | ORAL | Status: DC

## 2014-03-11 ENCOUNTER — Encounter: Payer: Self-pay | Admitting: Obstetrics

## 2014-03-11 NOTE — Progress Notes (Signed)
   Patient ID: [name], female   DOB: 02-24-1975, 39 y.o.   MRN: [id number]  Chief Complaint  Patient presents with  . Advice Only    Nexplanon Removal     HPI [name] is a 39 y.o. female.  Presents for consultation for Nexplanon removal because of AUB.  Also c/o malodorous, itchy discharge.  HPI  Past Medical History  Diagnosis Date  . Medical history non-contributory     Past Surgical History  Procedure Laterality Date  . Cholecystectomy    . Dilation and curettage of uterus      Family History  Problem Relation Age of Onset  . Diabetes Mother   . Hypertension Mother   . Thyroid disease Mother   . Colon cancer Father     Social History History  Substance Use Topics  . Smoking status: Never Smoker   . Smokeless tobacco: Never Used  . Alcohol Use: Yes     Comment: social    No Known Allergies  Current Outpatient Prescriptions  Medication Sig Dispense Refill  . azithromycin (ZITHROMAX) 1 G powder Take 1 packet (1 g total) by mouth once. 1 each 0  . fluconazole (DIFLUCAN) 150 MG tablet Take 1 tablet (150 mg total) by mouth once. 1 tablet 2  . metroNIDAZOLE (FLAGYL) 500 MG tablet Take 1 tablet (500 mg total) by mouth 2 (two) times daily. 14 tablet 0   No current facility-administered medications for this visit.    Review of Systems Review of Systems Constitutional: negative for fatigue and weight loss Respiratory: negative for cough and wheezing Cardiovascular: negative for chest pain, fatigue and palpitations Gastrointestinal: negative for abdominal pain and change in bowel habits Genitourinary:negative Integument/breast: negative for nipple discharge Musculoskeletal:negative for myalgias Neurological: negative for gait problems and tremors Behavioral/Psych: negative for abusive relationship, depression Endocrine: negative for temperature intolerance     Blood pressure 118/83, pulse 75, temperature 97.8 F (36.6 C), height 5\' 3"  (1.6 m),  weight 150 lb (68.04 kg).  Physical Exam Physical Exam General:   alert  Skin:   no rash or abnormalities  Lungs:   clear to auscultation bilaterally  Heart:   regular rate and rhythm, S1, S2 normal, no murmur, click, rub or gallop  Breasts:   normal without suspicious masses, skin or nipple changes or axillary nodes  Abdomen:  normal findings: no organomegaly, soft, non-tender and no hernia  Pelvis:  External genitalia: normal general appearance Urinary system: urethral meatus normal and bladder without fullness, nontender Vaginal: normal without tenderness, induration or masses Cervix: normal appearance Adnexa: normal bimanual exam Uterus: anteverted and non-tender, normal size     Data Reviewed Labs  Assessment    BV AUB Nexplanon surveillance    Plan  Flagyl / Diflucan Rx. Patient to return for Nexplanon removal.    Orders Placed This Encounter  Procedures  . SureSwab, Vaginosis/Vaginitis Plus   Meds ordered this encounter  Medications  . metroNIDAZOLE (FLAGYL) 500 MG tablet    Sig: Take 1 tablet (500 mg total) by mouth 2 (two) times daily.    Dispense:  14 tablet    Refill:  0  . fluconazole (DIFLUCAN) 150 MG tablet    Sig: Take 1 tablet (150 mg total) by mouth once.    Dispense:  1 tablet    Refill:  2

## 2014-03-17 ENCOUNTER — Other Ambulatory Visit: Payer: Self-pay | Admitting: Obstetrics

## 2014-03-17 LAB — SURESWAB, VAGINOSIS/VAGINITIS PLUS
ATOPOBIUM VAGINAE: 6.6 Log (cells/mL)
BV CATEGORY: UNDETERMINED — AB
C. ALBICANS, DNA: NOT DETECTED
C. GLABRATA, DNA: NOT DETECTED
C. parapsilosis, DNA: NOT DETECTED
C. trachomatis RNA, TMA: NOT DETECTED
C. tropicalis, DNA: NOT DETECTED
Gardnerella vaginalis: >8 Log (cells/mL)
LACTOBACILLUS SPECIES: DETECTED Log (cells/mL)
N. gonorrhoeae RNA, TMA: NOT DETECTED
T. VAGINALIS RNA, QL TMA: NOT DETECTED

## 2014-03-28 ENCOUNTER — Ambulatory Visit (INDEPENDENT_AMBULATORY_CARE_PROVIDER_SITE_OTHER): Payer: Self-pay | Admitting: Obstetrics

## 2014-03-28 ENCOUNTER — Encounter: Payer: Self-pay | Admitting: Obstetrics

## 2014-03-28 VITALS — BP 114/79 | HR 70 | Temp 98.0°F | Wt 147.0 lb

## 2014-03-28 DIAGNOSIS — Z308 Encounter for other contraceptive management: Secondary | ICD-10-CM

## 2014-03-28 DIAGNOSIS — Z3046 Encounter for surveillance of implantable subdermal contraceptive: Secondary | ICD-10-CM

## 2014-03-28 DIAGNOSIS — Z30011 Encounter for initial prescription of contraceptive pills: Secondary | ICD-10-CM

## 2014-03-28 MED ORDER — NORGESTIMATE-ETH ESTRADIOL 0.25-35 MG-MCG PO TABS: 1.0000 | ORAL_TABLET | Freq: Every day | ORAL | Status: DC

## 2014-03-29 ENCOUNTER — Encounter: Payer: Self-pay | Admitting: Obstetrics

## 2014-03-29 NOTE — Progress Notes (Signed)

## 2014-05-04 ENCOUNTER — Ambulatory Visit (INDEPENDENT_AMBULATORY_CARE_PROVIDER_SITE_OTHER): Payer: BLUE CROSS/BLUE SHIELD | Admitting: Obstetrics

## 2014-05-04 ENCOUNTER — Encounter: Payer: Self-pay | Admitting: Obstetrics

## 2014-05-04 VITALS — BP 116/70 | HR 91 | Temp 97.2°F | Ht 63.0 in | Wt 149.0 lb

## 2014-05-04 DIAGNOSIS — Z308 Encounter for other contraceptive management: Secondary | ICD-10-CM

## 2014-05-04 DIAGNOSIS — Z3046 Encounter for surveillance of implantable subdermal contraceptive: Secondary | ICD-10-CM

## 2014-05-05 ENCOUNTER — Encounter: Payer: Self-pay | Admitting: Obstetrics

## 2014-05-05 NOTE — Progress Notes (Signed)
Subjective:    Sonya Butler is a 39 y.o. female who presents for follow up after removal of Nexplanon. The patient has no complaints today. The patient is sexually active. Pertinent past medical history: none.  The information documented in the HPI was reviewed and verified.  Menstrual History: OB History    Gravida Para Term Preterm AB TAB SAB Ectopic Multiple Living   3 2 2  1 1    2        Patient's last menstrual period was 04/21/2014.   Patient Active Problem List   Diagnosis Date Noted  . Bacterial vaginal infection 08/03/2013   Past Medical History  Diagnosis Date  . Medical history non-contributory     Past Surgical History  Procedure Laterality Date  . Cholecystectomy    . Dilation and curettage of uterus       Current outpatient prescriptions:  .  norgestimate-ethinyl estradiol (SPRINTEC 28) 0.25-35 MG-MCG tablet, Take 1 tablet by mouth daily., Disp: 1 Package, Rfl: 11 No Known Allergies  History  Substance Use Topics  . Smoking status: Never Smoker   . Smokeless tobacco: Never Used  . Alcohol Use: 0.0 oz/week    0 Standard drinks or equivalent per week     Comment: social    Family History  Problem Relation Age of Onset  . Diabetes Mother   . Hypertension Mother   . Thyroid disease Mother   . Colon cancer Father   . Cancer Father          Objective:   BP 116/70 mmHg  Pulse 91  Temp(Src) 97.2 F (36.2 C)  Ht 5\' 3"  (1.6 m)  Wt 149 lb (67.586 kg)  BMI 26.40 kg/m2  LMP 04/21/2014  PE:      Left arm:  Incision healed with suture intact.  Suture cut below the knot and removed.   Lab Review Urine pregnancy test Labs reviewed yes Radiologic studies reviewed no    Assessment:    39 y.o., discontinuing Nexplanon, no contraindications.   Contraceptive  Counseling.  Wants OCP's.  Plan:   Sprintec 28 Rx   All questions answered. Contraception: OCP (estrogen/progesterone). Discussed healthy lifestyle modifications.  No orders of the  defined types were placed in this encounter.   No orders of the defined types were placed in this encounter.    Follow up as needed.

## 2014-05-25 ENCOUNTER — Telehealth: Payer: Self-pay | Admitting: *Deleted

## 2014-05-25 ENCOUNTER — Other Ambulatory Visit: Payer: Self-pay | Admitting: *Deleted

## 2014-05-25 DIAGNOSIS — N390 Urinary tract infection, site not specified: Secondary | ICD-10-CM

## 2014-05-25 MED ORDER — NITROFURANTOIN MONOHYD MACRO 100 MG PO CAPS: 100.0000 mg | ORAL_CAPSULE | Freq: Two times a day (BID) | ORAL | Status: DC

## 2014-05-25 NOTE — Telephone Encounter (Signed)
Patient reports she has UTI symptoms- can we call something in for her?

## 2014-05-25 NOTE — Telephone Encounter (Signed)
OK for Macrobid 1 po bid x 7 days, #14

## 2014-05-26 NOTE — Telephone Encounter (Signed)
Rx sent to pharmacy   

## 2014-06-24 ENCOUNTER — Encounter (HOSPITAL_COMMUNITY): Payer: Self-pay | Admitting: Emergency Medicine

## 2014-06-24 ENCOUNTER — Emergency Department (HOSPITAL_COMMUNITY)
Admission: EM | Admit: 2014-06-24 | Discharge: 2014-06-24 | Disposition: A | Discharge status: Home / Self Care | Payer: Medicaid Other | Attending: Emergency Medicine | Admitting: Emergency Medicine

## 2014-06-24 DIAGNOSIS — L02411 Cutaneous abscess of right axilla: Secondary | ICD-10-CM | POA: Diagnosis not present

## 2014-06-24 DIAGNOSIS — Z79899 Other long term (current) drug therapy: Secondary | ICD-10-CM | POA: Diagnosis not present

## 2014-06-24 DIAGNOSIS — L02412 Cutaneous abscess of left axilla: Secondary | ICD-10-CM | POA: Insufficient documentation

## 2014-06-24 DIAGNOSIS — L02419 Cutaneous abscess of limb, unspecified: Secondary | ICD-10-CM

## 2014-06-24 MED ORDER — CEPHALEXIN 500 MG PO CAPS: 500.0000 mg | ORAL_CAPSULE | Freq: Four times a day (QID) | ORAL | Status: DC

## 2014-06-24 MED ORDER — HYDROCODONE-ACETAMINOPHEN 5-325 MG PO TABS: 2.0000 | ORAL_TABLET | ORAL | Status: DC | PRN

## 2014-06-24 NOTE — ED Notes (Signed)
Pt reports shaving armpits about a week ago without water, put baking soda on armpits because she's been trying to use that instead of deodorant, and when she put the baking soda felt burning to armpits. Pt states she now has red bumps under both armpits, worse on the L side.

## 2014-06-24 NOTE — ED Provider Notes (Signed)
CSN: [id number]     Arrival date & time 06/24/14  2202 History   None   This chart was scribed for NP, Earley FavorGail Maia Handa, working with Bethann BerkshireJoseph Zammit, MD by Marica OtterNusrat Rahman, ED Scribe. This patient was seen in room WTR6/WTR6 and the patient's care was started at 10:53 PM Chief Complaint  Patient presents with  . Abscess   HPI PCP: No PCP Per Patient HPI Comments: [name] is a 39 y.o. female, with PMH noted below, who presents to the Emergency Department complaining of abscess to her armpits bilaterally (worse on left than right) onset one week ago after pt shaved her armpits without water. Pt reports taking ibuprofen and applying witch hazel without relief. Pt denies any medicinal allergies; possibility of pregnancy. Pt reports her last menstrual cycle was on 06/11/14.   Past Medical History  Diagnosis Date  . Medical history non-contributory    Past Surgical History  Procedure Laterality Date  . Cholecystectomy    . Dilation and curettage of uterus     Family History  Problem Relation Age of Onset  . Diabetes Mother   . Hypertension Mother   . Thyroid disease Mother   . Colon cancer Father   . Cancer Father    History  Substance Use Topics  . Smoking status: Never Smoker   . Smokeless tobacco: Never Used  . Alcohol Use: 0.0 oz/week    0 Standard drinks or equivalent per week     Comment: social   OB History    Gravida Para Term Preterm AB TAB SAB Ectopic Multiple Living   3 2 2  1 1    2      Review of Systems  Constitutional: Negative for fever and chills.  Skin: Positive for color change (small bumps under armpits bilaterally) and wound (abscess under armpits bilaterally).  Neurological: Negative for speech difficulty.  Psychiatric/Behavioral: Negative for confusion.  All other systems reviewed and are negative.  Allergies  Review of patient's allergies indicates no known allergies.  Home Medications   Prior to Admission medications   Medication Sig Start Date End  Date Taking? Authorizing Provider  nitrofurantoin, macrocrystal-monohydrate, (MACROBID) 100 MG capsule Take 1 capsule (100 mg total) by mouth 2 (two) times daily. 05/25/14   Brock Badharles A Harper, MD  norgestimate-ethinyl estradiol (SPRINTEC 28) 0.25-35 MG-MCG tablet Take 1 tablet by mouth daily. 03/28/14   Brock Badharles A Harper, MD   Triage Vitals: BP 128/73 mmHg  Pulse 94  Temp(Src) 98.3 F (36.8 C) (Oral)  Resp 16  Ht 5\' 3"  (1.6 m)  Wt 145 lb (65.772 kg)  BMI 25.69 kg/m2  SpO2 100%  LMP 06/12/2014 Physical Exam  Constitutional: She is oriented to person, place, and time. She appears well-developed and well-nourished. No distress.  HENT:  Head: Normocephalic and atraumatic.  Eyes: Conjunctivae and EOM are normal.  Neck: Neck supple. No tracheal deviation present.  Cardiovascular: Normal rate.   Pulmonary/Chest: Effort normal. No respiratory distress.  Musculoskeletal: Normal range of motion.  Neurological: She is alert and oriented to person, place, and time.  Skin: Skin is warm and dry.  Left axilla patient has 2 separate areas of firm nodule without surrounding erythema that are tender to touch.  No break in the skin. Right axilla patient has one firm nodule without surrounding erythema  Psychiatric: She has a normal mood and affect. Her behavior is normal.  Nursing note and vitals reviewed.   ED Course  Procedures (including critical care time) DIAGNOSTIC STUDIES:  Oxygen Saturation is 100% on RA, nl by my interpretation.    COORDINATION OF CARE: 10:55 PM-Discussed treatment plan which includes antibiotics and warm compresses with pt at bedside and pt agreed to plan.   Labs Review Labs Reviewed - No data to display  Imaging Review No results found.   EKG Interpretation None     Patient's nodules are firm.  They're not fluctuant.  They are not ready to be I indeed, at this time.  She has been started on Keflex and warm compresses.  She's been told to watch the area for changes  if they become fluctuant.  She is to return for IND.  Otherwise, the antibiotic and warm compresses.  Will help her body absorb this tissue MDM   Final diagnoses:  None        I personally performed the services described in this documentation, which was scribed in my presence. The recorded information has been reviewed and is accurate.   Earley FavorGail Sharmeka Palmisano, NP 06/24/14 [phone]  Bethann BerkshireJoseph Zammit, MD 06/24/14 [phone]

## 2014-06-24 NOTE — Discharge Instructions (Signed)
Abscess An abscess is an infected area that contains a collection of pus and debris.It can occur in almost any part of the body. An abscess is also known as a furuncle or boil. CAUSES  An abscess occurs when tissue gets infected. This can occur from blockage of oil or sweat glands, infection of hair follicles, or a minor injury to the skin. As the body tries to fight the infection, pus collects in the area and creates pressure under the skin. This pressure causes pain. People with weakened immune systems have difficulty fighting infections and get certain abscesses more often.  SYMPTOMS Usually an abscess develops on the skin and becomes a painful mass that is red, warm, and tender. If the abscess forms under the skin, you may feel a moveable soft area under the skin. Some abscesses break open (rupture) on their own, but most will continue to get worse without care. The infection can spread deeper into the body and eventually into the bloodstream, causing you to feel ill.  DIAGNOSIS  Your caregiver will take your medical history and perform a physical exam. A sample of fluid may also be taken from the abscess to determine what is causing your infection. TREATMENT  Your caregiver may prescribe antibiotic medicines to fight the infection. However, taking antibiotics alone usually does not cure an abscess. Your caregiver may need to make a small cut (incision) in the abscess to drain the pus. In some cases, gauze is packed into the abscess to reduce pain and to continue draining the area. HOME CARE INSTRUCTIONS   Only take over-the-counter or prescription medicines for pain, discomfort, or fever as directed by your caregiver.  If you were prescribed antibiotics, take them as directed. Finish them even if you start to feel better.  If gauze is used, follow your caregiver's directions for changing the gauze.  To avoid spreading the infection:  Keep your draining abscess covered with a  bandage.  Wash your hands well.  Do not share personal care items, towels, or whirlpools with others.  Avoid skin contact with others.  Keep your skin and clothes clean around the abscess.  Keep all follow-up appointments as directed by your caregiver. SEEK MEDICAL CARE IF:   You have increased pain, swelling, redness, fluid drainage, or bleeding.  You have muscle aches, chills, or a general ill feeling.  You have a fever. MAKE SURE YOU:   Understand these instructions.  Will watch your condition.  Will get help right away if you are not doing well or get worse. Document Released: 11/14/2004 Document Revised: 08/06/2011 Document Reviewed: 04/19/2011 [facility] Patient Information 2015 [place]ExitCare, [place]. This information is not intended to replace advice given to you by your health care provider. Make sure you discuss any questions you have with your health care provider. At this time.  You have firm nodules in both axilla you're being treated with antibiotics and warm compresses.  Hopefully, this will help your body absorb the forming abscess or area will help bring it to a point, it may get fluctuant where he can be drained.  Please watch the area carefully for changes

## 2014-08-04 ENCOUNTER — Ambulatory Visit (INDEPENDENT_AMBULATORY_CARE_PROVIDER_SITE_OTHER): Payer: BLUE CROSS/BLUE SHIELD | Admitting: Obstetrics

## 2014-08-04 ENCOUNTER — Encounter: Payer: Self-pay | Admitting: Obstetrics

## 2014-08-04 VITALS — BP 122/81 | HR 80 | Temp 98.3°F | Ht 63.0 in | Wt 151.0 lb

## 2014-08-04 DIAGNOSIS — Z3041 Encounter for surveillance of contraceptive pills: Secondary | ICD-10-CM | POA: Diagnosis not present

## 2014-08-04 DIAGNOSIS — Z0000 Encounter for general adult medical examination without abnormal findings: Secondary | ICD-10-CM | POA: Diagnosis not present

## 2014-08-04 DIAGNOSIS — Z124 Encounter for screening for malignant neoplasm of cervix: Secondary | ICD-10-CM

## 2014-08-04 DIAGNOSIS — Z01419 Encounter for gynecological examination (general) (routine) without abnormal findings: Secondary | ICD-10-CM

## 2014-08-04 NOTE — Progress Notes (Signed)
Subjective:        Sonya Butler is a 39 y.o. female here for a routine exam.  Current complaints: None.    Personal health questionnaire:  Is patient [name], have a family history of breast and/or ovarian cancer: no Is there a family history of uterine cancer diagnosed at age < 7, gastrointestinal cancer, urinary tract cancer, family member who is a Personnel officer syndrome-associated carrier: no Is the patient overweight and hypertensive, family history of diabetes, personal history of gestational diabetes, preeclampsia or PCOS: no Is patient over 54, have PCOS,  family history of premature CHD under age 89, diabetes, smoke, have hypertension or peripheral artery disease:  no At any time, has a partner hit, kicked or otherwise hurt or frightened you?: no Over the past 2 weeks, have you felt down, depressed or hopeless?: no Over the past 2 weeks, have you felt little interest or pleasure in doing things?:no   Gynecologic History Patient's last menstrual period was 07/19/2014. Contraception: OCP (estrogen/progesterone) Last Pap: 2015. Results were: normal Last mammogram: n/a. Results were: n/a  Obstetric History OB History  Gravida Para Term Preterm AB SAB TAB Ectopic Multiple Living  # Outcome Date GA Lbr Len/2nd Weight Sex Delivery Anes PTL Lv  3 TAB 2015        N  2 Term 12/27/04 [redacted]w[redacted]d  7 lb 4 oz (3.289 kg) M Vag-Spont EPI  Y  1 Term 05/03/92   7 lb 9 oz (3.43 kg) M Vag-Spont EPI  Y      Past Medical History  Diagnosis Date  . Medical history non-contributory     Past Surgical History  Procedure Laterality Date  . Cholecystectomy    . Dilation and curettage of uterus       Current outpatient prescriptions:  .  norgestimate-ethinyl estradiol (SPRINTEC 28) 0.25-35 MG-MCG tablet, Take 1 tablet by mouth daily., Disp: 1 Package, Rfl: 11 No Known Allergies  History  Substance Use Topics  . Smoking status: Never Smoker   . Smokeless tobacco:  Never Used  . Alcohol Use: 0.0 oz/week    0 Standard drinks or equivalent per week     Comment: social    Family History  Problem Relation Age of Onset  . Diabetes Mother   . Hypertension Mother   . Thyroid disease Mother   . Colon cancer Father   . Cancer Father       Review of Systems  Constitutional: negative for fatigue and weight loss Respiratory: negative for cough and wheezing Cardiovascular: negative for chest pain, fatigue and palpitations Gastrointestinal: negative for abdominal pain and change in bowel habits Musculoskeletal:negative for myalgias Neurological: negative for gait problems and tremors Behavioral/Psych: negative for abusive relationship, depression Endocrine: negative for temperature intolerance   Genitourinary:negative for abnormal menstrual periods, genital lesions, hot flashes, sexual problems and vaginal discharge Integument/breast: negative for breast lump, breast tenderness, nipple discharge and skin lesion(s)    Objective:       BP 122/81 mmHg  Pulse 80  Temp(Src) 98.3 F (36.8 C)  Ht  (1.6 m)  Wt 151 lb (68.493 kg)  BMI 26.76 kg/m2  LMP 07/19/2014 General:   alert  Skin:   no rash or abnormalities  Lungs:   clear to auscultation bilaterally  Heart:   regular rate and rhythm, S1, S2 normal, no murmur, click, rub or gallop  Breasts:   normal without suspicious  masses, skin or nipple changes or axillary nodes  Abdomen:  normal findings: no organomegaly, soft, non-tender and no hernia  Pelvis:  External genitalia: normal general appearance Urinary system: urethral meatus normal and bladder without fullness, nontender Vaginal: normal without tenderness, induration or masses Cervix: normal appearance Adnexa: normal bimanual exam Uterus: anteverted and non-tender, normal size   Lab Review Urine pregnancy test Labs reviewed yes Radiologic studies reviewed no    Assessment:    Healthy female exam.    Contraceptive management.   Pleased with OCP's.   Plan:    Education reviewed: low fat, low cholesterol diet, safe sex/STD prevention, self breast exams and weight bearing exercise. Contraception: OCP (estrogen/progesterone). Follow up in: 1 year.   No orders of the defined types were placed in this encounter.   Orders Placed This Encounter  Procedures  . GC/Chlamydia Probe Amp

## 2014-08-05 LAB — GC/CHLAMYDIA PROBE AMP
CT Probe RNA: NEGATIVE
GC Probe RNA: NEGATIVE

## 2014-08-08 LAB — PAP IG AND HPV HIGH-RISK: HPV DNA High Risk: NOT DETECTED

## 2014-08-23 ENCOUNTER — Emergency Department (INDEPENDENT_AMBULATORY_CARE_PROVIDER_SITE_OTHER)
Admission: EM | Admit: 2014-08-23 | Discharge: 2014-08-23 | Disposition: A | Discharge status: Home / Self Care | Payer: Medicaid Other | Source: Home / Self Care | Attending: Family Medicine | Admitting: Family Medicine

## 2014-08-23 ENCOUNTER — Encounter (HOSPITAL_COMMUNITY): Payer: Self-pay | Admitting: Emergency Medicine

## 2014-08-23 DIAGNOSIS — L0291 Cutaneous abscess, unspecified: Secondary | ICD-10-CM | POA: Diagnosis not present

## 2014-08-23 MED ORDER — CEPHALEXIN 500 MG PO CAPS: 500.0000 mg | ORAL_CAPSULE | Freq: Two times a day (BID) | ORAL | Status: DC

## 2014-08-23 NOTE — Discharge Instructions (Signed)
Abscess An abscess (boil or furuncle) is an infected area on or under the skin. This area is filled with yellowish-white fluid (pus) and other material (debris). HOME CARE   Only take medicines as told by your doctor.  If you were given antibiotic medicine, take it as directed. Finish the medicine even if you start to feel better.  If gauze is used, follow your doctor's directions for changing the gauze.  To avoid spreading the infection:  Keep your abscess covered with a bandage.  Wash your hands well.  Do not share personal care items, towels, or whirlpools with others.  Avoid skin contact with others.  Keep your skin and clothes clean around the abscess.  Keep all doctor visits as told. GET HELP RIGHT AWAY IF:   You have more pain, puffiness (swelling), or redness in the wound site.  You have more fluid or blood coming from the wound site.  You have muscle aches, chills, or you feel sick.  You have a fever. MAKE SURE YOU:   Understand these instructions.  Will watch your condition.  Will get help right away if you are not doing well or get worse. Document Released: 07/24/2007 Document Revised: 08/06/2011 Document Reviewed: 04/19/2011 [facility] LPExitCare Patient Information 2015 [place] HeightsExitCare, [place]LLC. This information is not intended to replace advice given to you by your health care provider. Make sure you discuss any questions you have with your health care provider.  Abscess An abscess is an infected area that contains a collection of pus and debris.It can occur in almost any part of the body. An abscess is also known as a furuncle or boil. CAUSES  An abscess occurs when tissue gets infected. This can occur from blockage of oil or sweat glands, infection of hair follicles, or a minor injury to the skin. As the body tries to fight the infection, pus collects in the area and creates pressure under the skin. This pressure causes pain. People with weakened immune systems have difficulty  fighting infections and get certain abscesses more often.  SYMPTOMS Usually an abscess develops on the skin and becomes a painful mass that is red, warm, and tender. If the abscess forms under the skin, you may feel a moveable soft area under the skin. Some abscesses break open (rupture) on their own, but most will continue to get worse without care. The infection can spread deeper into the body and eventually into the bloodstream, causing you to feel ill.  DIAGNOSIS  Your caregiver will take your medical history and perform a physical exam. A sample of fluid may also be taken from the abscess to determine what is causing your infection. TREATMENT  Your caregiver may prescribe antibiotic medicines to fight the infection. However, taking antibiotics alone usually does not cure an abscess. Your caregiver may need to make a small cut (incision) in the abscess to drain the pus. In some cases, gauze is packed into the abscess to reduce pain and to continue draining the area. HOME CARE INSTRUCTIONS   Only take over-the-counter or prescription medicines for pain, discomfort, or fever as directed by your caregiver.  If you were prescribed antibiotics, take them as directed. Finish them even if you start to feel better.  If gauze is used, follow your caregiver's directions for changing the gauze.  To avoid spreading the infection:  Keep your draining abscess covered with a bandage.  Wash your hands well.  Do not share personal care items, towels, or whirlpools with others.  Avoid skin contact  with others. °· Keep your skin and clothes clean around the abscess. °· Keep all follow-up appointments as directed by your caregiver. °SEEK MEDICAL CARE IF:  °· You have increased pain, swelling, redness, fluid drainage, or bleeding. °· You have muscle aches, chills, or a general ill feeling. °· You have a fever. °MAKE SURE YOU:  °· Understand these instructions. °· Will watch your condition. °· Will get help  right away if you are not doing well or get worse. °Document Released: 11/14/2004 Document Revised: 08/06/2011 Document Reviewed: 04/19/2011 °ExitCare® Patient Information ©2015 Exit[employer], LLC. This information is not intended to replace advice given to you by your health care provider. Make sure you discuss any questions you have with your health care provider. ° °

## 2014-08-23 NOTE — ED Notes (Signed)
Pt comes in with bilateral boils under axilla x 2 weeks ago No swelling or oozing noted S/P boil infection in May cleared up with ATB's  Denies fever,chills, n,v

## 2014-08-23 NOTE — ED Provider Notes (Signed)
CSN: [id number]     Arrival date & time 08/23/14  1833 History   First MD Initiated Contact with Patient 08/23/14 2021     Chief Complaint  Patient presents with  . Abscess    HPI  39 year old presenting with abscess right axilla. Reportedly has had this prior and has resolved on Keflex. Any fevers or chills. Noticed that she had some discomfort under right axilla and decided to come in for further evaluation. Otherwise patient has no new complaints  Past Medical History  Diagnosis Date  . Medical history non-contributory    Past Surgical History  Procedure Laterality Date  . Cholecystectomy    . Dilation and curettage of uterus     Family History  Problem Relation Age of Onset  . Diabetes Mother   . Hypertension Mother   . Thyroid disease Mother   . Colon cancer Father   . Cancer Father    History  Substance Use Topics  . Smoking status: Never Smoker   . Smokeless tobacco: Never Used  . Alcohol Use: 0.0 oz/week    0 Standard drinks or equivalent per week     Comment: social   OB History    Gravida Para Term Preterm AB TAB SAB Ectopic Multiple Living   3 2 2  1 1    2      Review of Systems  12 point review of systems reviewed and negative unless otherwise mentioned above  Allergies  Review of patient's allergies indicates no known allergies.  Home Medications   Prior to Admission medications   Medication Sig Start Date End Date Taking? Authorizing Provider  norgestimate-ethinyl estradiol (SPRINTEC 28) 0.25-35 MG-MCG tablet Take 1 tablet by mouth daily. 03/28/14   Brock Badharles A Harper, MD   BP 135/74 mmHg  Pulse 69  Temp(Src) 98 F (36.7 C) (Oral)  Resp 12  SpO2 100%  LMP 08/04/2014 Physical Exam  General: Alert, awake, oriented x3, in no acute distress. HEENT: No bruits, no goiter. Heart: Regular rate and rhythm, without murmurs, rubs, gallops. Lungs: Clear to auscultation bilaterally, no wheezes, equal chest rise Abdomen: Soft, nontender, nondistended,  positive bowel sounds. Extremities: No clubbing cyanosis or edema with positive pedal pulses. Neuro: Grossly intact, nonfocal. Skin: Patient has abscess at right axilla, tender to palpation, no discharge    ED Course  Procedures (including critical care time) Labs Review Labs Reviewed - No data to display  Imaging Review No results found.   MDM  No diagnosis found. Left axillary abscess  - Currently not draining. Has resolved in the past with Keflex. Will attempt trial of Keflex if recurs patient will have to f/u with General surgery for definitive treatment. She is aware and verbalizes agreement and understanding.    Penny Piarlando Shaarav Ripple, MD 08/23/14 2023

## 2014-09-23 ENCOUNTER — Encounter: Payer: Self-pay | Admitting: Obstetrics

## 2014-09-23 ENCOUNTER — Ambulatory Visit (INDEPENDENT_AMBULATORY_CARE_PROVIDER_SITE_OTHER): Payer: BLUE CROSS/BLUE SHIELD | Admitting: Certified Nurse Midwife

## 2014-09-23 VITALS — BP 128/72 | HR 76 | Temp 98.3°F | Ht 63.0 in | Wt 151.4 lb

## 2014-09-23 DIAGNOSIS — Z113 Encounter for screening for infections with a predominantly sexual mode of transmission: Secondary | ICD-10-CM | POA: Diagnosis not present

## 2014-09-23 NOTE — Progress Notes (Signed)
Patient ID: Sonya Butler, female   DOB: August 21, 1975, 39 y.o.   MRN: [id number]  Patient ID: Sonya Butler, female   DOB: June 17, 1975, 39 y.o.   MRN: [id number]  Chief Complaint  Patient presents with  . Results    pt. needs STD testing    HPI Sonya Butler is a 39 y.o. female.  C/O recent exposure to an STD, does not know what type.  Denies any vaginal discharge changes, odor, fever, or lower abdominal pain.  Denies any urinary symptoms/lumbar pain.    HPI  Past Medical History  Diagnosis Date  . Medical history non-contributory     Past Surgical History  Procedure Laterality Date  . Cholecystectomy    . Dilation and curettage of uterus      Family History  Problem Relation Age of Onset  . Diabetes Mother   . Hypertension Mother   . Thyroid disease Mother   . Colon cancer Father   . Cancer Father     Social History History  Substance Use Topics  . Smoking status: Never Smoker   . Smokeless tobacco: Never Used  . Alcohol Use: 0.0 oz/week    0 Standard drinks or equivalent per week     Comment: social    No Known Allergies  Current Outpatient Prescriptions  Medication Sig Dispense Refill  . norgestimate-ethinyl estradiol (SPRINTEC 28) 0.25-35 MG-MCG tablet Take 1 tablet by mouth daily. 1 Package 11  . cephALEXin (KEFLEX) 500 MG capsule Take 1 capsule (500 mg total) by mouth 2 (two) times daily. (Patient not taking: Reported on 09/23/2014) 14 capsule 0   No current facility-administered medications for this visit.    Review of Systems Review of Systems Constitutional: negative for fatigue and weight loss Respiratory: negative for cough and wheezing Cardiovascular: negative for chest pain, fatigue and palpitations Gastrointestinal: negative for abdominal pain and change in bowel habits Genitourinary:negative Integument/breast: negative for nipple discharge Musculoskeletal:negative for myalgias Neurological: negative for gait problems and  tremors Behavioral/Psych: negative for abusive relationship, depression Endocrine: negative for temperature intolerance     Blood pressure 128/72, pulse 76, temperature 98.3 F (36.8 C), height  (1.6 m), weight 151 lb 6.4 oz (68.675 kg), last menstrual period 08/29/2014.  Physical Exam Physical Exam General:   alert  Skin:   no rash or abnormalities  Lungs:   clear to auscultation bilaterally  Heart:   regular rate and rhythm, S1, S2 normal, no murmur, click, rub or gallop  Breasts:   deferred  Abdomen:  normal findings: no organomegaly, soft, non-tender and no hernia  Pelvis:  External genitalia: normal general appearance Urinary system: urethral meatus normal and bladder without fullness, nontender Vaginal: normal without tenderness, induration or masses Cervix: no CMT Adnexa: normal bimanual exam Uterus: anteverted and non-tender, normal size    50% of 15 min visit spent on counseling and coordination of care.   Data Reviewed Previous medical hx, labs, meds  Assessment     STD screening exam     Plan    Orders Placed This Encounter  Procedures  . SureSwab, Vaginosis/Vaginitis Plus  . HIV antibody (with reflex)  . Hepatitis B surface antigen  . RPR  . Hepatitis C antibody   No orders of the defined types were placed in this encounter.    Follow up as needed.

## 2014-09-24 LAB — RPR

## 2014-09-24 LAB — HIV ANTIBODY (ROUTINE TESTING W REFLEX): HIV 1&2 Ab, 4th Generation: NONREACTIVE

## 2014-09-24 LAB — HEPATITIS C ANTIBODY: HCV Ab: NEGATIVE

## 2014-09-24 LAB — HEPATITIS B SURFACE ANTIGEN: Hepatitis B Surface Ag: NEGATIVE

## 2014-09-27 ENCOUNTER — Other Ambulatory Visit: Payer: Self-pay | Admitting: Certified Nurse Midwife

## 2014-09-27 ENCOUNTER — Telehealth: Payer: Self-pay | Admitting: *Deleted

## 2014-09-27 DIAGNOSIS — B9689 Other specified bacterial agents as the cause of diseases classified elsewhere: Secondary | ICD-10-CM

## 2014-09-27 DIAGNOSIS — B3731 Acute candidiasis of vulva and vagina: Secondary | ICD-10-CM

## 2014-09-27 DIAGNOSIS — N760 Acute vaginitis: Secondary | ICD-10-CM

## 2014-09-27 DIAGNOSIS — B373 Candidiasis of vulva and vagina: Secondary | ICD-10-CM

## 2014-09-27 LAB — SURESWAB, VAGINOSIS/VAGINITIS PLUS
Atopobium vaginae: 5.7 Log (cells/mL)
C. PARAPSILOSIS, DNA: NOT DETECTED
C. albicans, DNA: DETECTED — AB
C. glabrata, DNA: NOT DETECTED
C. trachomatis RNA, TMA: NOT DETECTED
C. tropicalis, DNA: NOT DETECTED
Gardnerella vaginalis: >8 Log (cells/mL)
LACTOBACILLUS SPECIES: NOT DETECTED Log (cells/mL)
MEGASPHAERA SPECIES: 7.7 Log (cells/mL)
N. gonorrhoeae RNA, TMA: NOT DETECTED
T. vaginalis RNA, QL TMA: NOT DETECTED

## 2014-09-27 MED ORDER — FLUCONAZOLE 100 MG PO TABS: 100.0000 mg | ORAL_TABLET | Freq: Once | ORAL | Status: DC

## 2014-09-27 MED ORDER — METRONIDAZOLE 500 MG PO TABS
500.0000 mg | ORAL_TABLET | Freq: Two times a day (BID) | ORAL | Status: DC
Start: 2014-09-27 — End: 2014-12-26

## 2014-09-27 MED ORDER — TERCONAZOLE 0.4 % VA CREA: 1.0000 | TOPICAL_CREAM | Freq: Every day | VAGINAL | Status: DC

## 2014-09-27 NOTE — Telephone Encounter (Signed)
Patient called the office requesting her lab results.  Patient advised her lab results showed she has bacterial vaginosis and yeast. Patient advised all STD test are negative and her blood work is negative. Patient verbalized understanding. Patient advised to check with pharmacy for prescriptions.

## 2014-09-27 NOTE — Telephone Encounter (Signed)
Please let her know that Flagyl was sent for the BV, Diflucan and Terconazole cream for the yeast.  Thank you.

## 2014-10-21 ENCOUNTER — Encounter: Payer: Self-pay | Admitting: Certified Nurse Midwife

## 2014-10-21 ENCOUNTER — Ambulatory Visit: Payer: BLUE CROSS/BLUE SHIELD | Admitting: Certified Nurse Midwife

## 2014-10-21 ENCOUNTER — Ambulatory Visit (INDEPENDENT_AMBULATORY_CARE_PROVIDER_SITE_OTHER): Payer: BLUE CROSS/BLUE SHIELD | Admitting: Certified Nurse Midwife

## 2014-10-21 DIAGNOSIS — B373 Candidiasis of vulva and vagina: Secondary | ICD-10-CM

## 2014-10-21 DIAGNOSIS — B3731 Acute candidiasis of vulva and vagina: Secondary | ICD-10-CM

## 2014-10-21 MED ORDER — FLUCONAZOLE 100 MG PO TABS: 100.0000 mg | ORAL_TABLET | Freq: Once | ORAL | Status: DC

## 2014-10-21 MED ORDER — TERCONAZOLE 0.4 % VA CREA: 1.0000 | TOPICAL_CREAM | Freq: Every day | VAGINAL | Status: DC

## 2014-10-21 NOTE — Progress Notes (Signed)
Patient ID: CAELA HUOT, female   DOB: 1975/12/23, 39 y.o.   MRN: [id number]   No chief complaint on file.   HPI MASIE BERMINGHAM is a 39 y.o. female.  Here with c/o vaginal itching for several days, denies any odor.  Was treated at the start of August for BV & yeast.    HPI  Past Medical History  Diagnosis Date  . Medical history non-contributory     Past Surgical History  Procedure Laterality Date  . Cholecystectomy    . Dilation and curettage of uterus      Family History  Problem Relation Age of Onset  . Diabetes Mother   . Hypertension Mother   . Thyroid disease Mother   . Colon cancer Father   . Cancer Father     Social History Social History  Substance Use Topics  . Smoking status: Never Smoker   . Smokeless tobacco: Never Used  . Alcohol Use: 0.0 oz/week    0 Standard drinks or equivalent per week     Comment: social    No Known Allergies  Current Outpatient Prescriptions  Medication Sig Dispense Refill  . cephALEXin (KEFLEX) 500 MG capsule Take 1 capsule (500 mg total) by mouth 2 (two) times daily. (Patient not taking: Reported on 09/23/2014) 14 capsule 0  . fluconazole (DIFLUCAN) 100 MG tablet Take 1 tablet (100 mg total) by mouth once. Repeat dose in 48-72 hour. 3 tablet 0  . metroNIDAZOLE (FLAGYL) 500 MG tablet Take 1 tablet (500 mg total) by mouth 2 (two) times daily. (Patient not taking: Reported on 10/21/2014) 14 tablet 0  . norgestimate-ethinyl estradiol (SPRINTEC 28) 0.25-35 MG-MCG tablet Take 1 tablet by mouth daily. 1 Package 11  . terconazole (TERAZOL 7) 0.4 % vaginal cream Place 1 applicator vaginally at bedtime. 45 g 0   No current facility-administered medications for this visit.    Review of Systems Review of Systems Constitutional: negative for fatigue and weight loss Respiratory: negative for cough and wheezing Cardiovascular: negative for chest pain, fatigue and palpitations Gastrointestinal: negative for abdominal pain and change in  bowel habits Genitourinary:negative Integument/breast: negative for nipple discharge Musculoskeletal:negative for myalgias Neurological: negative for gait problems and tremors Behavioral/Psych: negative for abusive relationship, depression Endocrine: negative for temperature intolerance     Last menstrual period 08/29/2014.  Physical Exam Physical Exam General:   alert  Skin:   no rash or abnormalities  Lungs:   clear to auscultation bilaterally  Heart:   regular rate and rhythm, S1, S2 normal, no murmur, click, rub or gallop  Breasts:   normal without suspicious masses, skin or nipple changes or axillary nodes  Abdomen:  normal findings: no organomegaly, soft, non-tender and no hernia  Pelvis:  External genitalia: normal general appearance Urinary system: urethral meatus normal and bladder without fullness, nontender Vaginal: normal without tenderness, induration or masses, + erythema with cottage cheese like discharge noted.   Cervix: normal appearance Adnexa: normal bimanual exam Uterus: anteverted and non-tender, normal size    50% of 15 min visit spent on counseling and coordination of care.   Data Reviewed Previous medical hx, labs, meds  Assessment     Vulvovaginal candidasis     Plan    Orders Placed This Encounter  Procedures  . SureSwab, Vaginosis/Vaginitis Plus   Meds ordered this encounter  Medications  . fluconazole (DIFLUCAN) 100 MG tablet    Sig: Take 1 tablet (100 mg total) by mouth once. Repeat dose in 48-72 hour.  Dispense:  3 tablet    Refill:  0  . terconazole (TERAZOL 7) 0.4 % vaginal cream    Sig: Place 1 applicator vaginally at bedtime.    Dispense:  45 g    Refill:  0   Follow up as needed.

## 2014-10-26 LAB — SURESWAB, VAGINOSIS/VAGINITIS PLUS
Atopobium vaginae: 5.9 Log (cells/mL)
BV CATEGORY: UNDETERMINED — AB
C. GLABRATA, DNA: NOT DETECTED
C. TROPICALIS, DNA: NOT DETECTED
C. albicans, DNA: NOT DETECTED
C. parapsilosis, DNA: NOT DETECTED
C. trachomatis RNA, TMA: NOT DETECTED
LACTOBACILLUS SPECIES: 5 Log (cells/mL)
MEGASPHAERA SPECIES: 7.2 Log (cells/mL)
N. gonorrhoeae RNA, TMA: NOT DETECTED
T. vaginalis RNA, QL TMA: NOT DETECTED

## 2014-10-27 ENCOUNTER — Other Ambulatory Visit: Payer: Self-pay | Admitting: Certified Nurse Midwife

## 2014-10-27 DIAGNOSIS — N760 Acute vaginitis: Secondary | ICD-10-CM

## 2014-10-27 DIAGNOSIS — B9689 Other specified bacterial agents as the cause of diseases classified elsewhere: Secondary | ICD-10-CM

## 2014-10-27 MED ORDER — TINIDAZOLE 500 MG PO TABS: 2.0000 g | ORAL_TABLET | Freq: Every day | ORAL | Status: AC

## 2014-12-26 ENCOUNTER — Telehealth: Payer: Self-pay | Admitting: *Deleted

## 2014-12-26 DIAGNOSIS — N760 Acute vaginitis: Secondary | ICD-10-CM

## 2014-12-26 DIAGNOSIS — B9689 Other specified bacterial agents as the cause of diseases classified elsewhere: Secondary | ICD-10-CM

## 2014-12-26 MED ORDER — METRONIDAZOLE 500 MG PO TABS: 500.0000 mg | ORAL_TABLET | Freq: Two times a day (BID) | ORAL | Status: DC

## 2014-12-26 NOTE — Telephone Encounter (Signed)
Patient thinks she has BV again. 3:49 LM on VM  Rx  sent to her pharmacy if symptoms do not improve may need an appointment.

## 2015-05-31 ENCOUNTER — Telehealth: Payer: Self-pay | Admitting: *Deleted

## 2015-05-31 NOTE — Telephone Encounter (Signed)
Patient states she is having symptoms of a UTI and would like treatment called to the pharmacy. Patient is complaining of frequency.

## 2015-06-02 ENCOUNTER — Other Ambulatory Visit: Payer: Self-pay | Admitting: Obstetrics

## 2015-06-02 DIAGNOSIS — N390 Urinary tract infection, site not specified: Secondary | ICD-10-CM

## 2015-06-02 MED ORDER — CEPHALEXIN 500 MG PO CAPS: 500.0000 mg | ORAL_CAPSULE | Freq: Two times a day (BID) | ORAL | Status: DC

## 2015-06-02 NOTE — Telephone Encounter (Signed)
Keflex Rx °

## 2015-07-12 ENCOUNTER — Encounter: Payer: Self-pay | Admitting: Obstetrics

## 2015-07-12 ENCOUNTER — Ambulatory Visit (INDEPENDENT_AMBULATORY_CARE_PROVIDER_SITE_OTHER): Payer: BLUE CROSS/BLUE SHIELD | Admitting: Obstetrics

## 2015-07-12 VITALS — BP 124/81 | HR 74 | Wt 156.0 lb

## 2015-07-12 DIAGNOSIS — Z1389 Encounter for screening for other disorder: Secondary | ICD-10-CM | POA: Diagnosis not present

## 2015-07-12 DIAGNOSIS — Z3041 Encounter for surveillance of contraceptive pills: Secondary | ICD-10-CM

## 2015-07-12 DIAGNOSIS — Z01419 Encounter for gynecological examination (general) (routine) without abnormal findings: Secondary | ICD-10-CM | POA: Diagnosis not present

## 2015-07-12 DIAGNOSIS — Z30011 Encounter for initial prescription of contraceptive pills: Secondary | ICD-10-CM

## 2015-07-12 DIAGNOSIS — B9689 Other specified bacterial agents as the cause of diseases classified elsewhere: Secondary | ICD-10-CM

## 2015-07-12 DIAGNOSIS — N760 Acute vaginitis: Secondary | ICD-10-CM

## 2015-07-12 LAB — POCT URINALYSIS DIPSTICK
BILIRUBIN UA: NEGATIVE
Blood, UA: NEGATIVE
GLUCOSE UA: NEGATIVE
Ketones, UA: NEGATIVE
Nitrite, UA: NEGATIVE
SPEC GRAV UA: 1.015
Urobilinogen, UA: NEGATIVE
pH, UA: 6

## 2015-07-12 MED ORDER — CLINDAMYCIN HCL 300 MG PO CAPS: 300.0000 mg | ORAL_CAPSULE | Freq: Three times a day (TID) | ORAL | Status: DC

## 2015-07-12 MED ORDER — NORGESTIMATE-ETH ESTRADIOL 0.25-35 MG-MCG PO TABS
1.0000 | ORAL_TABLET | Freq: Every day | ORAL | Status: DC
Start: 2015-07-12 — End: 2015-12-20

## 2015-07-15 ENCOUNTER — Other Ambulatory Visit: Payer: Self-pay | Admitting: Obstetrics

## 2015-07-15 ENCOUNTER — Encounter: Payer: Self-pay | Admitting: Obstetrics

## 2015-07-15 LAB — PAP IG AND HPV HIGH-RISK
HPV, high-risk: NEGATIVE
PAP Smear Comment: 0

## 2015-07-15 NOTE — Progress Notes (Signed)
Subjective:        Sonya Butler is a 40 y.o. female here for a routine exam.  Current complaints: None.    Personal health questionnaire:  Is patient Sonya Butler, have a family history of breast and/or ovarian cancer: no Is there a family history of uterine cancer diagnosed at age < 41, gastrointestinal cancer, urinary tract cancer, family member who is a Personnel officer syndrome-associated carrier: no Is the patient overweight and hypertensive, family history of diabetes, personal history of gestational diabetes, preeclampsia or PCOS: no Is patient over 73, have PCOS,  family history of premature CHD under age 51, diabetes, smoke, have hypertension or peripheral artery disease:  no At any time, has a partner hit, kicked or otherwise hurt or frightened you?: no Over the past 2 weeks, have you felt down, depressed or hopeless?: no Over the past 2 weeks, have you felt little interest or pleasure in doing things?:no   Gynecologic History No LMP recorded. Contraception: OCP (estrogen/progesterone) Last Pap: 2016. Results were: normal Last mammogram: n/a. Results were: n/a  Obstetric History OB History  Gravida Para Term Preterm AB SAB TAB Ectopic Multiple Living  # Outcome Date GA Lbr Len/2nd Weight Sex Delivery Anes PTL Lv  3 TAB 2015        N  2 Term 12/27/04 [redacted]w[redacted]d  7 lb 4 oz (3.289 kg) M Vag-Spont EPI  Y  1 Term 05/03/92   7 lb 9 oz (3.43 kg) M Vag-Spont EPI  Y      Past Medical History  Diagnosis Date  . Medical history non-contributory     Past Surgical History  Procedure Laterality Date  . Cholecystectomy    . Dilation and curettage of uterus       Current outpatient prescriptions:  .  norgestimate-ethinyl estradiol (SPRINTEC 28) 0.25-35 MG-MCG tablet, Take 1 tablet by mouth daily., Disp: 1 Package, Rfl: 11 .  cephALEXin (KEFLEX) 500 MG capsule, Take 1 capsule (500 mg total) by mouth 2 (two) times daily., Disp: 14 capsule, Rfl: 0 .  clindamycin  (CLEOCIN) 300 MG capsule, Take 1 capsule (300 mg total) by mouth 3 (three) times daily., Disp: 30 capsule, Rfl: 2 No Known Allergies  Social History  Substance Use Topics  . Smoking status: Never Smoker   . Smokeless tobacco: Never Used  . Alcohol Use: 0.0 oz/week    0 Standard drinks or equivalent per week     Comment: social    Family History  Problem Relation Age of Onset  . Diabetes Mother   . Hypertension Mother   . Thyroid disease Mother   . Colon cancer Father   . Cancer Father       Review of Systems  Constitutional: negative for fatigue and weight loss Respiratory: negative for cough and wheezing Cardiovascular: negative for chest pain, fatigue and palpitations Gastrointestinal: negative for abdominal pain and change in bowel habits Musculoskeletal:negative for myalgias Neurological: negative for gait problems and tremors Behavioral/Psych: negative for abusive relationship, depression Endocrine: negative for temperature intolerance   Genitourinary:negative for abnormal menstrual periods, genital lesions, hot flashes, sexual problems and vaginal discharge Integument/breast: negative for breast lump, breast tenderness, nipple discharge and skin lesion(s)    Objective:       BP 124/81 mmHg  Pulse 74  Wt 156 lb (70.761 kg) General:   alert  Skin:   no rash or abnormalities  Lungs:   clear  to auscultation bilaterally  Heart:   regular rate and rhythm, S1, S2 normal, no murmur, click, rub or gallop  Breasts:   normal without suspicious masses, skin or nipple changes or axillary nodes  Abdomen:  normal findings: no organomegaly, soft, non-tender and no hernia  Pelvis:  External genitalia: normal general appearance Urinary system: urethral meatus normal and bladder without fullness, nontender Vaginal: normal without tenderness, induration or masses Cervix: normal appearance Adnexa: normal bimanual exam Uterus: anteverted and non-tender, normal size   Lab  Review Urine pregnancy test Labs reviewed yes Radiologic studies reviewed no    Assessment:    Healthy female exam.    Plan:    Education reviewed: calcium supplements, low fat, low cholesterol diet, safe sex/STD prevention, self breast exams and weight bearing exercise. Contraception: OCP (estrogen/progesterone). Follow up in: 1 year.   Meds ordered this encounter  Medications  . clindamycin (CLEOCIN) 300 MG capsule    Sig: Take 1 capsule (300 mg total) by mouth 3 (three) times daily.    Dispense:  30 capsule    Refill:  2  . norgestimate-ethinyl estradiol (SPRINTEC 28) 0.25-35 MG-MCG tablet    Sig: Take 1 tablet by mouth daily.    Dispense:  1 Package    Refill:  11   Orders Placed This Encounter  Procedures  . POCT urinalysis dipstick

## 2015-07-16 ENCOUNTER — Other Ambulatory Visit: Payer: Self-pay | Admitting: Obstetrics

## 2015-07-16 DIAGNOSIS — A5901 Trichomonal vulvovaginitis: Secondary | ICD-10-CM

## 2015-07-16 LAB — NUSWAB VG, CANDIDA 6SP
ATOPOBIUM VAGINAE: HIGH {score} — AB
BVAB 2: HIGH {score} — AB
CANDIDA ALBICANS, NAA: NEGATIVE
CANDIDA LUSITANIAE, NAA: NEGATIVE
Candida glabrata, NAA: NEGATIVE
Candida krusei, NAA: NEGATIVE
Candida parapsilosis, NAA: NEGATIVE
Candida tropicalis, NAA: NEGATIVE
MEGASPHAERA 1: HIGH {score} — AB
TRICH VAG BY NAA: POSITIVE — AB

## 2015-07-16 MED ORDER — TINIDAZOLE 500 MG PO TABS: 2.0000 g | ORAL_TABLET | Freq: Every day | ORAL | Status: DC

## 2015-08-04 ENCOUNTER — Other Ambulatory Visit: Payer: Self-pay | Admitting: *Deleted

## 2015-08-04 DIAGNOSIS — B379 Candidiasis, unspecified: Secondary | ICD-10-CM

## 2015-08-04 MED ORDER — FLUCONAZOLE 150 MG PO TABS: 150.0000 mg | ORAL_TABLET | Freq: Once | ORAL | Status: DC

## 2015-08-04 NOTE — Progress Notes (Signed)
Pt called to office with S&S of yeast infection. Pt was recently treated for BV with antibiotic.  Pt was made aware treatment could be sent per protocol.  Diflucan 150mg  was sent to pharmacy. Pt advised to call office next week if symptoms do not resolve.

## 2015-08-10 ENCOUNTER — Ambulatory Visit: Payer: BLUE CROSS/BLUE SHIELD | Admitting: Obstetrics

## 2015-08-14 ENCOUNTER — Ambulatory Visit (INDEPENDENT_AMBULATORY_CARE_PROVIDER_SITE_OTHER): Payer: BLUE CROSS/BLUE SHIELD | Admitting: Obstetrics

## 2015-08-14 ENCOUNTER — Encounter: Payer: Self-pay | Admitting: Obstetrics

## 2015-08-14 VITALS — BP 127/88 | HR 99 | Temp 98.3°F | Wt 156.0 lb

## 2015-08-14 DIAGNOSIS — B373 Candidiasis of vulva and vagina: Secondary | ICD-10-CM

## 2015-08-14 DIAGNOSIS — B3731 Acute candidiasis of vulva and vagina: Secondary | ICD-10-CM

## 2015-08-14 DIAGNOSIS — Z09 Encounter for follow-up examination after completed treatment for conditions other than malignant neoplasm: Secondary | ICD-10-CM

## 2015-08-14 LAB — POCT URINALYSIS DIPSTICK
BILIRUBIN UA: NEGATIVE
Glucose, UA: NEGATIVE
KETONES UA: NEGATIVE
Nitrite, UA: NEGATIVE
Spec Grav, UA: 1.025
Urobilinogen, UA: NEGATIVE
pH, UA: 5

## 2015-08-14 MED ORDER — FLUCONAZOLE 150 MG PO TABS: 150.0000 mg | ORAL_TABLET | Freq: Once | ORAL | Status: DC

## 2015-08-14 NOTE — Progress Notes (Signed)
Patient ID: Sonya Butler, female   DOB: [birth year], 40 y.o.   MRN: [id number]  Chief Complaint  Patient presents with  . Follow-up    HPI Sonya ConsLatara C Shiffer is a 40 y.o. female.  C/O vaginal itching and discharge.  Recent treatment of BV with Clindamycin.  HPI  Past Medical History  Diagnosis Date  . Medical history non-contributory     Past Surgical History  Procedure Laterality Date  . Cholecystectomy    . Dilation and curettage of uterus      Family History  Problem Relation Age of Onset  . Diabetes Mother   . Hypertension Mother   . Thyroid disease Mother   . Colon cancer Father   . Cancer Father     Social History Social History  Substance Use Topics  . Smoking status: Never Smoker   . Smokeless tobacco: Never Used  . Alcohol Use: 0.0 oz/week    0 Standard drinks or equivalent per week     Comment: social    No Known Allergies  Current Outpatient Prescriptions  Medication Sig Dispense Refill  . fluconazole (DIFLUCAN) 150 MG tablet Take 1 tablet (150 mg total) by mouth once. 1 tablet 2  . norgestimate-ethinyl estradiol (SPRINTEC 28) 0.25-35 MG-MCG tablet Take 1 tablet by mouth daily. (Patient not taking: Reported on 08/14/2015) 1 Package 11   No current facility-administered medications for this visit.    Review of Systems Review of Systems Constitutional: negative for fatigue and weight loss Respiratory: negative for cough and wheezing Cardiovascular: negative for chest pain, fatigue and palpitations Gastrointestinal: negative for abdominal pain and change in bowel habits Genitourinary:positive for vaginal itching and discharge Integument/breast: negative for nipple discharge Musculoskeletal:negative for myalgias Neurological: negative for gait problems and tremors Behavioral/Psych: negative for abusive relationship, depression Endocrine: negative for temperature intolerance     Blood pressure 127/88, pulse 99, temperature 98.3 F (36.8 C), weight  156 lb (70.761 kg).  Physical Exam Physical Exam General:   alert  Skin:   no rash or abnormalities  Lungs:   clear to auscultation bilaterally  Heart:   regular rate and rhythm, S1, S2 normal, no murmur, click, rub or gallop  Breasts:   normal without suspicious masses, skin or nipple changes or axillary nodes  Abdomen:  normal findings: no organomegaly, soft, non-tender and no hernia  Pelvis:  External genitalia: normal general appearance Urinary system: urethral meatus normal and bladder without fullness, nontender Vaginal: normal without tenderness, induration or masses.  Menstrual blood in vault Cervix: normal appearance.  Menstrual blood in os Adnexa: normal bimanual exam Uterus: anteverted and non-tender, normal size      Data Reviewed Labs  Assessment     Candida vaginitis     Plan    Wet [prep ordered Diflucan Rx F/U prn   Orders Placed This Encounter  Procedures  . POCT urinalysis dipstick   Meds ordered this encounter  Medications  . fluconazole (DIFLUCAN) 150 MG tablet    Sig: Take 1 tablet (150 mg total) by mouth once.    Dispense:  1 tablet    Refill:  2

## 2015-08-15 NOTE — Addendum Note (Signed)
Addended by: Rushie ChestnutMCINTYRE, Gwyndolyn Guilford E on: 08/15/2015 08:42 AM   Modules accepted: Orders

## 2015-08-18 ENCOUNTER — Other Ambulatory Visit: Payer: Self-pay | Admitting: Obstetrics

## 2015-08-20 LAB — NUSWAB VG, CANDIDA 6SP
CANDIDA ALBICANS, NAA: POSITIVE — AB
CANDIDA GLABRATA, NAA: NEGATIVE
CANDIDA PARAPSILOSIS, NAA: NEGATIVE
Candida krusei, NAA: NEGATIVE
Candida lusitaniae, NAA: NEGATIVE
Candida tropicalis, NAA: NEGATIVE
Trich vag by NAA: NEGATIVE

## 2015-08-25 ENCOUNTER — Other Ambulatory Visit: Payer: Self-pay | Admitting: Obstetrics

## 2015-10-24 ENCOUNTER — Telehealth: Payer: Self-pay | Admitting: *Deleted

## 2015-10-24 MED ORDER — CEPHALEXIN 500 MG PO CAPS: 500.0000 mg | ORAL_CAPSULE | Freq: Two times a day (BID) | ORAL | 0 refills | Status: AC

## 2015-10-24 NOTE — Telephone Encounter (Signed)
Patient  Is having UTI symptoms. She would like treatment- she can not come to the office. She states the Keflex worked well for her and she would like to try that again. Reviewed reasons for frequent UTI- patient is not emptying enough during the day. She will try to do better at work.  Rx sent to pharmacy per protocol.

## 2015-12-07 ENCOUNTER — Telehealth: Payer: Self-pay | Admitting: *Deleted

## 2015-12-07 NOTE — Telephone Encounter (Signed)
Patient is calling requesting medication to treat UTI- she is complaining of frequency. Patient was treated 10/24/2015 for UTI. She will need an appointment

## 2015-12-12 ENCOUNTER — Other Ambulatory Visit: Payer: Self-pay | Admitting: Obstetrics

## 2015-12-12 DIAGNOSIS — Z1231 Encounter for screening mammogram for malignant neoplasm of breast: Secondary | ICD-10-CM

## 2015-12-20 ENCOUNTER — Telehealth: Payer: Self-pay | Admitting: *Deleted

## 2015-12-20 ENCOUNTER — Ambulatory Visit (INDEPENDENT_AMBULATORY_CARE_PROVIDER_SITE_OTHER): Payer: BLUE CROSS/BLUE SHIELD | Admitting: Obstetrics and Gynecology

## 2015-12-20 VITALS — BP 120/80 | HR 94 | Wt 160.0 lb

## 2015-12-20 DIAGNOSIS — R399 Unspecified symptoms and signs involving the genitourinary system: Secondary | ICD-10-CM

## 2015-12-20 LAB — POCT URINALYSIS DIPSTICK
Bilirubin, UA: NEGATIVE
Glucose, UA: NEGATIVE
KETONES UA: NEGATIVE
Nitrite, UA: NEGATIVE
PH UA: 5
RBC UA: 250
SPEC GRAV UA: 1.025
Urobilinogen, UA: NEGATIVE

## 2015-12-20 MED ORDER — PHENAZOPYRIDINE HCL 200 MG PO TABS: 200.0000 mg | ORAL_TABLET | Freq: Three times a day (TID) | ORAL | 1 refills | Status: DC | PRN

## 2015-12-20 NOTE — Telephone Encounter (Signed)
Patient is calling with UTI symptoms. She would like treatment. [facility]

## 2015-12-20 NOTE — Progress Notes (Signed)
Pt presents today with c/o some pelvic pressure and concerned about a possible UTI. Reports she had similar Sx about a week ago. [facility] 5 days and Sx resovled until now. She denies any fever/chills/n/c/urinary freq or urge. LMP was a little different and she requests UPT today as well  PE AF VSS Lungs clear Heart RRR Abd soft + BS  A/P UTI Sx Will check Uc. Pyridium til culture returns. UPT negative.

## 2015-12-24 LAB — URINE CULTURE, OB REFLEX

## 2015-12-24 LAB — CULTURE, OB URINE

## 2015-12-27 ENCOUNTER — Ambulatory Visit
Admission: RE | Admit: 2015-12-27 | Discharge: 2015-12-27 | Disposition: A | Discharge status: Home / Self Care | Payer: BLUE CROSS/BLUE SHIELD | Source: Ambulatory Visit | Attending: Obstetrics | Admitting: Obstetrics

## 2015-12-27 ENCOUNTER — Other Ambulatory Visit: Payer: Self-pay | Admitting: *Deleted

## 2015-12-27 DIAGNOSIS — Z1231 Encounter for screening mammogram for malignant neoplasm of breast: Secondary | ICD-10-CM

## 2016-01-01 ENCOUNTER — Other Ambulatory Visit: Payer: Self-pay | Admitting: *Deleted

## 2016-01-01 ENCOUNTER — Encounter: Payer: Self-pay | Admitting: *Deleted

## 2016-01-01 DIAGNOSIS — N3000 Acute cystitis without hematuria: Secondary | ICD-10-CM

## 2016-01-01 MED ORDER — CIPROFLOXACIN HCL 250 MG PO TABS: 250.0000 mg | ORAL_TABLET | Freq: Two times a day (BID) | ORAL | 0 refills | Status: AC

## 2016-04-09 ENCOUNTER — Ambulatory Visit (INDEPENDENT_AMBULATORY_CARE_PROVIDER_SITE_OTHER): Payer: BLUE CROSS/BLUE SHIELD

## 2016-04-09 DIAGNOSIS — Z3201 Encounter for pregnancy test, result positive: Secondary | ICD-10-CM | POA: Diagnosis not present

## 2016-04-09 DIAGNOSIS — Z3200 Encounter for pregnancy test, result unknown: Secondary | ICD-10-CM

## 2016-04-09 LAB — POCT URINE PREGNANCY: PREG TEST UR: POSITIVE — AB

## 2016-04-09 NOTE — Progress Notes (Signed)
Patient presents for Pregnancy Test. UPT -POSITIVE LMP 02/21/2016; EDD 11/27/16. Patient advised to schedule appointment for Prenatal Care.

## 2016-05-07 ENCOUNTER — Inpatient Hospital Stay (HOSPITAL_COMMUNITY): Payer: BLUE CROSS/BLUE SHIELD

## 2016-05-07 ENCOUNTER — Inpatient Hospital Stay (HOSPITAL_COMMUNITY)
Admission: AD | Admit: 2016-05-07 | Discharge: 2016-05-08 | Disposition: A | Discharge status: Home / Self Care | Payer: BLUE CROSS/BLUE SHIELD | Source: Ambulatory Visit | Attending: Family Medicine | Admitting: Family Medicine

## 2016-05-07 ENCOUNTER — Encounter (HOSPITAL_COMMUNITY): Payer: Self-pay | Admitting: Student

## 2016-05-07 ENCOUNTER — Telehealth: Payer: Self-pay | Admitting: *Deleted

## 2016-05-07 DIAGNOSIS — O209 Hemorrhage in early pregnancy, unspecified: Secondary | ICD-10-CM

## 2016-05-07 DIAGNOSIS — N760 Acute vaginitis: Secondary | ICD-10-CM | POA: Insufficient documentation

## 2016-05-07 DIAGNOSIS — Z202 Contact with and (suspected) exposure to infections with a predominantly sexual mode of transmission: Secondary | ICD-10-CM | POA: Insufficient documentation

## 2016-05-07 DIAGNOSIS — N939 Abnormal uterine and vaginal bleeding, unspecified: Secondary | ICD-10-CM | POA: Diagnosis not present

## 2016-05-07 DIAGNOSIS — Z3201 Encounter for pregnancy test, result positive: Secondary | ICD-10-CM | POA: Diagnosis not present

## 2016-05-07 DIAGNOSIS — Z30011 Encounter for initial prescription of contraceptive pills: Secondary | ICD-10-CM

## 2016-05-07 DIAGNOSIS — B9689 Other specified bacterial agents as the cause of diseases classified elsewhere: Secondary | ICD-10-CM | POA: Diagnosis not present

## 2016-05-07 LAB — URINALYSIS, ROUTINE W REFLEX MICROSCOPIC
Bilirubin Urine: NEGATIVE
Glucose, UA: NEGATIVE mg/dL
KETONES UR: NEGATIVE mg/dL
Nitrite: NEGATIVE
PH: 6 (ref 5.0–8.0)
Protein, ur: 100 mg/dL — AB
SPECIFIC GRAVITY, URINE: 1.02 (ref 1.005–1.030)

## 2016-05-07 LAB — POCT PREGNANCY, URINE: PREG TEST UR: POSITIVE — AB

## 2016-05-07 LAB — WET PREP, GENITAL
SPERM: NONE SEEN
Trich, Wet Prep: NONE SEEN
Yeast Wet Prep HPF POC: NONE SEEN

## 2016-05-07 LAB — CBC
HCT: 35.4 % — ABNORMAL LOW (ref 36.0–46.0)
Hemoglobin: 11.9 g/dL — ABNORMAL LOW (ref 12.0–15.0)
MCH: 27.7 pg (ref 26.0–34.0)
MCHC: 33.6 g/dL (ref 30.0–36.0)
MCV: 82.3 fL (ref 78.0–100.0)
PLATELETS: 251 10*3/uL (ref 150–400)
RBC: 4.3 MIL/uL (ref 3.87–5.11)
RDW: 13.8 % (ref 11.5–15.5)
WBC: 6.4 10*3/uL (ref 4.0–10.5)

## 2016-05-07 LAB — HCG, QUANTITATIVE, PREGNANCY: hCG, Beta Chain, Quant, S: 120 m[IU]/mL — ABNORMAL HIGH (ref <5)

## 2016-05-07 MED ORDER — TINIDAZOLE 500 MG PO TABS: ORAL_TABLET | ORAL | 0 refills | Status: DC

## 2016-05-07 MED ORDER — CEFTRIAXONE SODIUM 250 MG IJ SOLR
250.0000 mg | Freq: Once | INTRAMUSCULAR | Status: AC
  Administered 2016-05-07: 250 mg via INTRAMUSCULAR
  Filled 2016-05-07: qty 250

## 2016-05-07 MED ORDER — AZITHROMYCIN 250 MG PO TABS
1000.0000 mg | ORAL_TABLET | Freq: Once | ORAL | Status: AC
  Administered 2016-05-07: 1000 mg via ORAL
  Filled 2016-05-07: qty 4

## 2016-05-07 MED ORDER — NORETHIN-ETH ESTRAD-FE BIPHAS 1 MG-10 MCG / 10 MCG PO TABS: 1.0000 | ORAL_TABLET | Freq: Every day | ORAL | 4 refills | Status: DC

## 2016-05-07 NOTE — MAU Note (Addendum)
PT  SAYS  TOOK PILLS  FOR TAB ON   2-24  - FIRST DOSE.      2ND   DOSE  ON  2-25-   ALL OK UNTIL   TODAY-  JUST  BLEEDING - SOMETIMES  HEAVY   .    TODAY SHE PASSED  SOMETHING   HARD   - MAYBE   BLOOD  CLOTS AND  TISSUE   - SHE  THOUGHT       FEELS  CRAMPS-   ON BCP-      OB  DR -   Charyl BiggerFAMINA

## 2016-05-07 NOTE — MAU Note (Signed)
Pt presents stating something fell out when using the bathroom. States she had an abortion at the end of February using the pill. Has been taking birth control pills and has been bleeding since. States she passed something black and red that came out while she was urinating. Also concerned for STI's. Some cramping in her back.

## 2016-05-07 NOTE — Telephone Encounter (Signed)
Patient is calling- she thinks she has BV. 2:14 Called patient- informed we could treat the BV per protocol- she does prefer Tinidazole because the flagyl is so hard on digestion. She also states she had a termination at the end of February and she needs to continue the birth control pills that he was given at her discharge. She was given LO Loestrin.

## 2016-05-07 NOTE — MAU Provider Note (Signed)
History     CSN: [id number]  Arrival date and time: 05/07/16 2109   First Provider Initiated Contact with Patient 05/07/16 2314      Chief Complaint  Patient presents with  . Vaginal Bleeding  . Exposure to STD   HPI Sonya Butler is a 41 y.o. [phone] female who presents for vaginal bleeding & STD exposure. Patient went to [facility] last month for TAB -- states she was measuring [redacted] weeks pregnant & took abortion pills 2/24 & 2/25. Had large amount of bleeding the next day & felt like the passed the pregnancy. Had f/u appointment with them; states they did an ultrasound & told her "everything was fine". Has had intermittent spotting since then that increased yesterday with episode of heavy bleeding & passage of a "black clump". Has been taking OCPs since abortion. Has had unprotected intercourse since 2 weeks ago.  Was told by her boyfriend that he was diagnosed with an infection & she needed to be treated. Patient unsure which infection.  Denies abdominal pain, fever, or vaginal discharge.   OB History    Gravida Para Term Preterm AB Living   4 2 2   1 2    SAB TAB Ectopic Multiple Live Births     1     2      Past Medical History:  Diagnosis Date  . Medical history non-contributory     Past Surgical History:  Procedure Laterality Date  . CHOLECYSTECTOMY    . DILATION AND CURETTAGE OF UTERUS      Family History  Problem Relation Age of Onset  . Diabetes Mother   . Hypertension Mother   . Thyroid disease Mother   . Colon cancer Father   . Cancer Father   . Breast cancer Paternal Grandmother     Social History  Substance Use Topics  . Smoking status: Never Smoker  . Smokeless tobacco: Never Used  . Alcohol use 0.0 oz/week     Comment: social    Allergies: No Known Allergies  Prescriptions Prior to Admission  Medication Sig Dispense Refill Last Dose  . Norethindrone-Ethinyl Estradiol-Fe Biphas (LO LOESTRIN FE) 1 MG-10 MCG / 10 MCG tablet Take 1 tablet by  mouth daily. 3 Package 4 05/07/2016 at Unknown time  . phenazopyridine (PYRIDIUM) 200 MG tablet Take 1 tablet (200 mg total) by mouth 3 (three) times daily as needed for pain (urethral spasm). 15 tablet 1   . tinidazole (TINDAMAX) 500 MG tablet 1 gram by mouth daily for 5 days 10 tablet 0 NOT PICKED UP    Review of Systems  Constitutional: Negative for chills and fever.  Gastrointestinal: Negative.   Genitourinary: Positive for vaginal bleeding. Negative for dyspareunia, dysuria and vaginal discharge.   Physical Exam   Blood pressure 128/90, resp. rate 18, height 5\' 4"  (1.626 m), weight 162 lb (73.5 kg), last menstrual period 11/18/2015.  Physical Exam  Nursing note and vitals reviewed. Constitutional: She is oriented to person, place, and time. She appears well-developed and well-nourished. No distress.  HENT:  Head: Normocephalic and atraumatic.  Eyes: Conjunctivae are normal. Right eye exhibits no discharge. Left eye exhibits no discharge. No scleral icterus.  Neck: Normal range of motion.  Cardiovascular: Normal rate, regular rhythm and normal heart sounds.   No murmur heard. Respiratory: Effort normal and breath sounds normal. No respiratory distress. She has no wheezes.  GI: Soft. Bowel sounds are normal. She exhibits no distension. There is no tenderness. There is no  rebound and no guarding.  Genitourinary: Uterus normal. Cervix exhibits no motion tenderness and no friability. There is bleeding (minimal amount of rust colored blood, no active bleeding) in the vagina.  Genitourinary Comments: Cervix closed  Neurological: She is alert and oriented to person, place, and time.  Skin: Skin is warm and dry. She is not diaphoretic.  Psychiatric: She has a normal mood and affect. Her behavior is normal. Judgment and thought content normal.    MAU Course  Procedures Results for orders placed or performed during the hospital encounter of 05/07/16 (from the past 24 hour(s))  Wet prep,  genital     Status: Abnormal   Collection Time: 05/07/16  8:50 PM  Result Value Ref Range   Yeast Wet Prep HPF POC NONE SEEN NONE SEEN   Trich, Wet Prep NONE SEEN NONE SEEN   Clue Cells Wet Prep HPF POC PRESENT (A) NONE SEEN   WBC, Wet Prep HPF POC MODERATE (A) NONE SEEN   Sperm NONE SEEN   Urinalysis, Routine w reflex microscopic (not at [facility])     Status: Abnormal   Collection Time: 05/07/16  9:34 PM  Result Value Ref Range   Color, Urine AMBER (A) YELLOW   APPearance CLOUDY (A) CLEAR   Specific Gravity, Urine 1.020 1.005 - 1.030   pH 6.0 5.0 - 8.0   Glucose, UA NEGATIVE NEGATIVE mg/dL   Hgb urine dipstick LARGE (A) NEGATIVE   Bilirubin Urine NEGATIVE NEGATIVE   Ketones, ur NEGATIVE NEGATIVE mg/dL   Protein, ur 409 (A) NEGATIVE mg/dL   Nitrite NEGATIVE NEGATIVE   Leukocytes, UA MODERATE (A) NEGATIVE   RBC / HPF 6-30 0 - 5 RBC/hpf   WBC, UA TOO NUMEROUS TO COUNT 0 - 5 WBC/hpf   Bacteria, UA RARE (A) NONE SEEN   Squamous Epithelial / LPF 6-30 (A) NONE SEEN   Mucous PRESENT   Pregnancy, urine POC     Status: Abnormal   Collection Time: 05/07/16  9:45 PM  Result Value Ref Range   Preg Test, Ur POSITIVE (A) NEGATIVE  ABO/Rh     Status: None (Preliminary result)   Collection Time: 05/07/16 10:03 PM  Result Value Ref Range   ABO/RH(D) B POS   hCG, quantitative, pregnancy     Status: Abnormal   Collection Time: 05/07/16 10:03 PM  Result Value Ref Range   hCG, Beta Chain, Quant, S 120 (H) <5 mIU/mL  CBC     Status: Abnormal   Collection Time: 05/07/16 10:03 PM  Result Value Ref Range   WBC 6.4 4.0 - 10.5 K/uL   RBC 4.30 3.87 - 5.11 MIL/uL   Hemoglobin 11.9 (L) 12.0 - 15.0 g/dL   HCT 81.1 (L) 91.4 - 78.2 %   MCV 82.3 78.0 - 100.0 fL   MCH 27.7 26.0 - 34.0 pg   MCHC 33.6 30.0 - 36.0 g/dL   RDW 95.6 21.3 - 08.6 %   Platelets 251 150 - 400 K/uL   US Ob Comp Less 14 Wks  Result Date: 05/07/2016 CLINICAL DATA:  Initial evaluation for bleeding for 1 month. Possible early  pregnancy. EXAM: OBSTETRIC <14 WK ULTRASOUND TECHNIQUE: Transabdominal ultrasound was performed for evaluation of the gestation as well as the maternal uterus and adnexal regions. COMPARISON:  None. FINDINGS: Intrauterine gestational sac: None. Yolk sac:  N/A Embryo:  N/A Cardiac Activity: N/A Heart Rate: N/A bpm MSD:   mm    w     d CRL:  mm    w  d                  US EDC: Subchorionic hemorrhage:  None visualized. Maternal uterus/adnexae: Normal sonographic appearance of the ovaries. No abnormality within the adnexa. No free fluid. Incidental note made of a small uterine fibroid measuring 1.0 x 1.x 0.6 cm. IMPRESSION: 1. No intrauterine gestational sac identified. Correlation with serum beta HCG recommended. Recommend serial beta hCGs with close interval follow-up ultrasound as clinically warranted. 2. No other acute abnormality within the pelvis. 3. Fibroid uterus. Electronically Signed   By: Rise MuBenjamin  McClintock M.D.   On: 05/07/2016 23:25   [place] Ob Transvaginal  Result Date: 05/07/2016 CLINICAL DATA:  Initial evaluation for bleeding for 1 month. Possible early pregnancy. EXAM: OBSTETRIC <14 WK ULTRASOUND TECHNIQUE: Transabdominal ultrasound was performed for evaluation of the gestation as well as the maternal uterus and adnexal regions. COMPARISON:  None. FINDINGS: Intrauterine gestational sac: None. Yolk sac:  N/A Embryo:  N/A Cardiac Activity: N/A Heart Rate: N/A bpm MSD:   mm    w     d CRL:     mm    w  d                  US EDC: Subchorionic hemorrhage:  None visualized. Maternal uterus/adnexae: Normal sonographic appearance of the ovaries. No abnormality within the adnexa. No free fluid. Incidental note made of a small uterine fibroid measuring 1.0 x 1.x 0.6 cm. IMPRESSION: 1. No intrauterine gestational sac identified. Correlation with serum beta HCG recommended. Recommend serial beta hCGs with close interval follow-up ultrasound as clinically warranted. 2. No other acute abnormality within the  pelvis. 3. Fibroid uterus. Electronically Signed   By: Rise MuBenjamin  McClintock M.D.   On: 05/07/2016 23:25     MDM UPT positive -- unsure if from previous pregnancy or new pregnancy, labs & ultrasound ordered BHCG 120 B positive Ultrasound shows no IUP, retained POC, or evidence of ectopic Rocephin & azitrhomycin given for STD exposure Assessment and Plan  A: 1. Positive pregnancy test   2. Vaginal bleeding in pregnancy, first trimester   3. BV (bacterial vaginosis)   4. STD exposure    P: Discharge home Rx flagyl Pt to return to MAU Thursday evening (states can't come during the day d/t work schedule) for repeat BHCG D/c OCP until f/u No intercourse  GC/CT & HIV pending   Judeth Hornrin Cady Hafen 05/07/2016, 11:14 PM

## 2016-05-08 DIAGNOSIS — N939 Abnormal uterine and vaginal bleeding, unspecified: Secondary | ICD-10-CM | POA: Diagnosis not present

## 2016-05-08 DIAGNOSIS — Z3201 Encounter for pregnancy test, result positive: Secondary | ICD-10-CM | POA: Diagnosis not present

## 2016-05-08 LAB — ABO/RH: ABO/RH(D): B POS

## 2016-05-08 NOTE — Discharge Instructions (Signed)
Return to care   If you have heavier bleeding that soaks through more that 2 pads per hour for an hour or more  If you bleed so much that you feel like you might pass out or you do pass out  If you have significant abdominal pain that is not improved with Tylenol   If you develop a fever > 100.5       Bacterial Vaginosis Bacterial vaginosis is an infection of the vagina. It happens when too many germs (bacteria) grow in the vagina. This infection puts you at risk for infections from sex (STIs). Treating this infection can lower your risk for some STIs. You should also treat this if you are pregnant. It can cause your baby to be born early. Follow these instructions at home: Medicines   Take over-the-counter and prescription medicines only as told by your doctor.  Take or use your antibiotic medicine as told by your doctor. Do not stop taking or using it even if you start to feel better. General instructions   If you your sexual partner is a woman, tell her that you have this infection. She needs to get treatment if she has symptoms. If you have a female partner, he does not need to be treated.  During treatment:  Avoid sex.  Do not douche.  Avoid alcohol as told.  Avoid breastfeeding as told.  Drink enough fluid to keep your pee (urine) clear or pale yellow.  Keep your vagina and butt (rectum) clean.  Wash the area with warm water every day.  Wipe from front to back after you use the toilet.  Keep all follow-up visits as told by your doctor. This is important. Preventing this condition   Do not douche.  Use only warm water to wash around your vagina.  Use protection when you have sex. This includes:  Latex condoms.  Dental dams.  Limit how many people you have sex with. It is best to only have sex with the same person (be monogamous).  Get tested for STIs. Have your partner get tested.  Wear underwear that is cotton or lined with cotton.  Avoid tight  pants and pantyhose. This is most important in summer.  Do not use any products that have nicotine or tobacco in them. These include cigarettes and e-cigarettes. If you need help quitting, ask your doctor.  Do not use illegal drugs.  Limit how much alcohol you drink. Contact a doctor if:  Your symptoms do not get better, even after you are treated.  You have more discharge or pain when you pee (urinate).  You have a fever.  You have pain in your belly (abdomen).  You have pain with sex.  Your bleed from your vagina between periods. Summary  This infection happens when too many germs (bacteria) grow in the vagina.  Treating this condition can lower your risk for some infections from sex (STIs).  You should also treat this if you are pregnant. It can cause early (premature) birth.  Do not stop taking or using your antibiotic medicine even if you start to feel better. This information is not intended to replace advice given to you by your health care provider. Make sure you discuss any questions you have with your health care provider. Document Released: 11/14/2007 Document Revised: 10/21/2015 Document Reviewed: 10/21/2015 Elsevier Interactive Patient Education  2017 [employer]

## 2016-05-09 ENCOUNTER — Inpatient Hospital Stay (HOSPITAL_COMMUNITY)
Admission: AD | Admit: 2016-05-09 | Discharge: 2016-05-09 | Disposition: A | Discharge status: Home / Self Care | Payer: BLUE CROSS/BLUE SHIELD | Source: Ambulatory Visit | Attending: Obstetrics & Gynecology | Admitting: Obstetrics & Gynecology

## 2016-05-09 DIAGNOSIS — Z800 Family history of malignant neoplasm of digestive organs: Secondary | ICD-10-CM | POA: Diagnosis not present

## 2016-05-09 DIAGNOSIS — Z803 Family history of malignant neoplasm of breast: Secondary | ICD-10-CM | POA: Insufficient documentation

## 2016-05-09 DIAGNOSIS — N939 Abnormal uterine and vaginal bleeding, unspecified: Secondary | ICD-10-CM

## 2016-05-09 DIAGNOSIS — Z9889 Other specified postprocedural states: Secondary | ICD-10-CM | POA: Insufficient documentation

## 2016-05-09 DIAGNOSIS — Z809 Family history of malignant neoplasm, unspecified: Secondary | ICD-10-CM | POA: Diagnosis not present

## 2016-05-09 DIAGNOSIS — Z9049 Acquired absence of other specified parts of digestive tract: Secondary | ICD-10-CM | POA: Diagnosis not present

## 2016-05-09 DIAGNOSIS — Z332 Encounter for elective termination of pregnancy: Secondary | ICD-10-CM | POA: Diagnosis not present

## 2016-05-09 DIAGNOSIS — Z8249 Family history of ischemic heart disease and other diseases of the circulatory system: Secondary | ICD-10-CM | POA: Diagnosis not present

## 2016-05-09 DIAGNOSIS — Z833 Family history of diabetes mellitus: Secondary | ICD-10-CM | POA: Diagnosis not present

## 2016-05-09 LAB — HIV ANTIBODY (ROUTINE TESTING W REFLEX): HIV Screen 4th Generation wRfx: NONREACTIVE

## 2016-05-09 LAB — GC/CHLAMYDIA PROBE AMP (~~LOC~~) NOT AT ARMC
CHLAMYDIA, DNA PROBE: NEGATIVE
Neisseria Gonorrhea: NEGATIVE

## 2016-05-09 LAB — HCG, QUANTITATIVE, PREGNANCY: hCG, Beta Chain, Quant, S: 65 m[IU]/mL — ABNORMAL HIGH (ref <5)

## 2016-05-09 NOTE — MAU Provider Note (Signed)
  History     CSN: [id number]  Arrival date and time: 05/09/16 2024   None     Chief Complaint  Patient presents with  . Vaginal Bleeding   Sonya Butler is a 41 y.o. [id number] who is S/P termination. She was seen here after unprotected intercourse and uncertain if she had a new pregnancy or HCG still present from termination. She reports that her bleeding is about the same as yesterday with some minimal cramping.    Vaginal Bleeding  The patient's primary symptoms include vaginal bleeding. This is a new problem. The current episode started in the past 7 days. The problem has been gradually improving. The pain is mild. Pregnant now: S/P termination  Pertinent negatives include no chills, fever, nausea or vomiting. The vaginal bleeding is lighter than menses. She has not been passing clots. She has not been passing tissue. Nothing aggravates the symptoms. She has tried nothing for the symptoms. It is possible (treated with azithromycin and rocephin on 3/20 ) that her partner has an STD.    Past Medical History:  Diagnosis Date  . Medical history non-contributory     Past Surgical History:  Procedure Laterality Date  . CHOLECYSTECTOMY    . DILATION AND CURETTAGE OF UTERUS      Family History  Problem Relation Age of Onset  . Diabetes Mother   . Hypertension Mother   . Thyroid disease Mother   . Colon cancer Father   . Cancer Father   . Breast cancer Paternal Grandmother     Social History  Substance Use Topics  . Smoking status: Never Smoker  . Smokeless tobacco: Never Used  . Alcohol use 0.0 oz/week     Comment: social    Allergies: No Known Allergies  Prescriptions Prior to Admission  Medication Sig Dispense Refill Last Dose  . tinidazole (TINDAMAX) 500 MG tablet 1 gram by mouth daily for 5 days 10 tablet 0 NOT PICKED UP    Review of Systems  Constitutional: Negative for chills and fever.  Gastrointestinal: Negative for nausea and vomiting.  Genitourinary:  Positive for vaginal bleeding.   Physical Exam   Blood pressure 128/78, pulse 64, temperature 98.3 F (36.8 Butler), temperature source Oral, resp. rate 18, last menstrual period 11/18/2015.  Physical Exam  Nursing note and vitals reviewed. Constitutional: She is oriented to person, place, and time. She appears well-developed and well-nourished. No distress.  HENT:  Head: Normocephalic.  Cardiovascular: Normal rate.   Respiratory: Effort normal.  Musculoskeletal: Normal range of motion.  Neurological: She is alert and oriented to person, place, and time.  Psychiatric: She has a normal mood and affect.     Results for Sonya Butler, Sonya Butler (MRN [id number]) as of 05/09/2016 21:42  Ref. Range 05/07/2016 22:03 05/07/2016 23:00 05/09/2016 20:50  HCG, Beta Chain, Quant, S Latest Ref Range: <5 mIU/mL 120 (H)  65 (H)    MAU Course  Procedures  MDM   Assessment and Plan   1. Therapeutic abortion in first trimester    DC home Comfort measures reviewed  1st Trimester precautions  Bleeding precautions Ectopic precautions RX: none  Return to MAU as needed FU with OB as planned  Follow-up Information    HARPER,CHARLES A, MD Follow up.   Specialty:  Obstetrics and Gynecology Contact information: [address] [place] [address] [phone]            Tawnya CrookHogan, Sonya [name] 05/09/2016, 9:53 PM

## 2016-05-09 NOTE — MAU Note (Signed)
PT  SAYS   SHE WAS  BLEEDING   HERE  BEFORE   AND  IS  STILL BLEEDING. - RED - SMALL  AMT  .   NO CRAMPS  -  HERE  TO HAVE  LABS  REPEATED.

## 2016-05-09 NOTE — Discharge Instructions (Signed)
Get help right away if:  You have bad cramps or pain in your back or belly (abdomen).  You have a fever.  You pass large clumps of blood (clots) from your vagina that are walnut-sized or larger. Save the clumps for your doctor to see.  You pass large amounts of tissue from your vagina. Save the tissue for your doctor to see.  You have more bleeding.  You have thick, bad-smelling fluid (discharge) coming from the vagina.  You get lightheaded, weak, or you pass out (faint).  You have chills. This information is not intended to replace advice given to you by your health care provider. Make sure you discuss any questions you have with your health care provider. Document Released: 04/29/2011 Document Revised: 07/13/2015 Document Reviewed: 03/07/2011 Elsevier Interactive Patient Education  2017 [employer]

## 2016-05-29 ENCOUNTER — Ambulatory Visit: Payer: Medicaid Other | Admitting: Obstetrics

## 2016-12-15 ENCOUNTER — Ambulatory Visit (HOSPITAL_COMMUNITY)
Admission: EM | Admit: 2016-12-15 | Discharge: 2016-12-15 | Disposition: A | Discharge status: Home / Self Care | Payer: BLUE CROSS/BLUE SHIELD

## 2016-12-15 ENCOUNTER — Encounter (HOSPITAL_COMMUNITY): Payer: Self-pay | Admitting: Emergency Medicine

## 2016-12-15 DIAGNOSIS — N898 Other specified noninflammatory disorders of vagina: Secondary | ICD-10-CM | POA: Diagnosis not present

## 2016-12-15 DIAGNOSIS — N3001 Acute cystitis with hematuria: Secondary | ICD-10-CM

## 2016-12-15 DIAGNOSIS — L02419 Cutaneous abscess of limb, unspecified: Secondary | ICD-10-CM | POA: Diagnosis not present

## 2016-12-15 DIAGNOSIS — Z3202 Encounter for pregnancy test, result negative: Secondary | ICD-10-CM | POA: Diagnosis not present

## 2016-12-15 DIAGNOSIS — Z299 Encounter for prophylactic measures, unspecified: Secondary | ICD-10-CM

## 2016-12-15 LAB — POCT URINALYSIS DIP (DEVICE)
Bilirubin Urine: NEGATIVE
GLUCOSE, UA: NEGATIVE mg/dL
KETONES UR: NEGATIVE mg/dL
Nitrite: POSITIVE — AB
Protein, ur: NEGATIVE mg/dL
Specific Gravity, Urine: >=1.03 (ref 1.005–1.030)
Urobilinogen, UA: 0.2 mg/dL (ref 0.0–1.0)
pH: 5.5 (ref 5.0–8.0)

## 2016-12-15 LAB — POCT PREGNANCY, URINE: PREG TEST UR: NEGATIVE

## 2016-12-15 MED ORDER — DOXYCYCLINE HYCLATE 100 MG PO CAPS: 100.0000 mg | ORAL_CAPSULE | Freq: Two times a day (BID) | ORAL | 0 refills | Status: AC

## 2016-12-15 MED ORDER — NITROFURANTOIN MONOHYD MACRO 100 MG PO CAPS: 100.0000 mg | ORAL_CAPSULE | Freq: Two times a day (BID) | ORAL | 0 refills | Status: DC

## 2016-12-15 MED ORDER — FLUCONAZOLE 150 MG PO TABS: 150.0000 mg | ORAL_TABLET | Freq: Once | ORAL | 0 refills | Status: AC

## 2016-12-15 NOTE — Discharge Instructions (Signed)
Use a hot compress often.

## 2016-12-15 NOTE — ED Provider Notes (Signed)
12/15/2016 6:03 PM   DOB: 1975-09-26 / MRN: [id number]  SUBJECTIVE:  Sonya Butler is a 41 y.o. female presenting for abscesses under her arms.  Has had these before. She is getting worse.   Complains of vaginal discharge.  Some urinary urgnecy.  No flank pain or fever.   She has No Known Allergies.   She  has a past medical history of Medical history non-contributory.    She  reports that she has never smoked. She has never used smokeless tobacco. She reports that she drinks alcohol. She reports that she does not use drugs. She  reports that she currently engages in sexual activity and has had female partners. She reports using the following methods of birth control/protection: OCP and Pill. The patient  has a past surgical history that includes Cholecystectomy and Dilation and curettage of uterus.  Her family history includes Breast cancer in her paternal grandmother; Cancer in her father; Colon cancer in her father; Diabetes in her mother; Hypertension in her mother; Thyroid disease in her mother.  Review of Systems  Constitutional: Negative for chills, diaphoresis and fever.  Gastrointestinal: Negative for nausea.  Genitourinary: Positive for urgency. Negative for dysuria, flank pain and hematuria.  Skin: Positive for rash. Negative for itching.  Neurological: Negative for dizziness.    OBJECTIVE:  BP 123/83 (BP Location: Left Arm)   Pulse 79   Temp 98 F (36.7 C) (Oral)   Resp 20   LMP 12/08/2016   SpO2 100%   Breastfeeding? No   Physical Exam  Constitutional: She is active.  Non-toxic appearance.  Cardiovascular: Normal rate, S1 normal and S2 normal.  Exam reveals no gallop, no friction rub and no decreased pulses.   No murmur heard. Pulmonary/Chest: Effort normal. No tachypnea.    Abdominal: Soft. She exhibits no distension and no mass. There is no tenderness. There is no rebound and no guarding.  Musculoskeletal: She exhibits no edema.  Neurological: She is alert.    Skin: Skin is warm and dry. She is not diaphoretic. No pallor.    Results for orders placed or performed during the hospital encounter of 12/15/16 (from the past 72 hour(s))  POCT urinalysis dip (device)     Status: Abnormal   Collection Time: 12/15/16  4:44 PM  Result Value Ref Range   Glucose, UA NEGATIVE NEGATIVE mg/dL   Bilirubin Urine NEGATIVE NEGATIVE   Ketones, ur NEGATIVE NEGATIVE mg/dL   Specific Gravity, Urine >=1.030 1.005 - 1.030   Hgb urine dipstick TRACE (A) NEGATIVE   pH 5.5 5.0 - 8.0   Protein, ur NEGATIVE NEGATIVE mg/dL   Urobilinogen, UA 0.2 0.0 - 1.0 mg/dL   Nitrite POSITIVE (A) NEGATIVE   Leukocytes, UA TRACE (A) NEGATIVE    Comment: Biochemical Testing Only. Please order routine urinalysis from main lab if confirmatory testing is needed.  Pregnancy, urine POC     Status: None   Collection Time: 12/15/16  4:54 PM  Result Value Ref Range   Preg Test, Ur NEGATIVE NEGATIVE    Comment:        THE SENSITIVITY OF THIS METHODOLOGY IS >24 mIU/mL     No results found.  ASSESSMENT AND PLAN:  Axillary abscess - Most likely hydradenitis. Doxy.   Acute cystitis with hematuria - Nitrofurantoin.  Need for prophylactic measure - Diflucan for history of abx induced yeast vaginitis   The patient is advised to call or return to clinic if she does not see an improvement in symptoms,  or to seek the care of the closest emergency department if she worsens with the above plan.   Deliah Boston, MHS, PA-C 12/15/2016 6:03 PM   Ofilia Neas, PA-C 12/15/16 Julio Sicks

## 2016-12-15 NOTE — ED Triage Notes (Signed)
Pt here for abscess on bilateral axilla onset 1 week  Denies fevers, drainage  Also c/o vag d/c onset 2 weeks   Denies urinary sx, abd pain, fevers, n/v/d  Sexually active in a 4 year monogamous relationship w/occasional condom use  No concerns for STIs  A&O x4... NAD... Ambulatory

## 2016-12-16 LAB — POCT PREGNANCY, URINE: Preg Test, Ur: NEGATIVE

## 2016-12-19 ENCOUNTER — Other Ambulatory Visit: Payer: Self-pay | Admitting: Obstetrics

## 2016-12-19 DIAGNOSIS — Z1231 Encounter for screening mammogram for malignant neoplasm of breast: Secondary | ICD-10-CM

## 2017-01-02 ENCOUNTER — Ambulatory Visit
Admission: RE | Admit: 2017-01-02 | Discharge: 2017-01-02 | Disposition: A | Discharge status: Home / Self Care | Payer: BLUE CROSS/BLUE SHIELD | Source: Ambulatory Visit | Attending: Obstetrics | Admitting: Obstetrics

## 2017-01-02 DIAGNOSIS — Z1231 Encounter for screening mammogram for malignant neoplasm of breast: Secondary | ICD-10-CM | POA: Diagnosis present

## 2017-01-20 ENCOUNTER — Ambulatory Visit (HOSPITAL_COMMUNITY)
Admission: EM | Admit: 2017-01-20 | Discharge: 2017-01-20 | Disposition: A | Discharge status: Home / Self Care | Payer: BLUE CROSS/BLUE SHIELD | Attending: Emergency Medicine | Admitting: Emergency Medicine

## 2017-01-20 ENCOUNTER — Other Ambulatory Visit: Payer: Self-pay

## 2017-01-20 ENCOUNTER — Encounter (HOSPITAL_COMMUNITY): Payer: Self-pay | Admitting: Emergency Medicine

## 2017-01-20 DIAGNOSIS — L02411 Cutaneous abscess of right axilla: Secondary | ICD-10-CM | POA: Diagnosis not present

## 2017-01-20 DIAGNOSIS — L089 Local infection of the skin and subcutaneous tissue, unspecified: Secondary | ICD-10-CM | POA: Diagnosis not present

## 2017-01-20 DIAGNOSIS — B958 Unspecified staphylococcus as the cause of diseases classified elsewhere: Secondary | ICD-10-CM | POA: Diagnosis not present

## 2017-01-20 DIAGNOSIS — L439 Lichen planus, unspecified: Secondary | ICD-10-CM | POA: Diagnosis not present

## 2017-01-20 DIAGNOSIS — Z1620 Resistance to unspecified antibiotic: Secondary | ICD-10-CM | POA: Diagnosis not present

## 2017-01-20 DIAGNOSIS — R21 Rash and other nonspecific skin eruption: Secondary | ICD-10-CM | POA: Diagnosis present

## 2017-01-20 MED ORDER — DOXYCYCLINE HYCLATE 100 MG PO CAPS: 100.0000 mg | ORAL_CAPSULE | Freq: Two times a day (BID) | ORAL | 0 refills | Status: DC

## 2017-01-20 MED ORDER — LIDOCAINE HCL (PF) 2 % IJ SOLN
INTRAMUSCULAR | Status: AC
  Filled 2017-01-20: qty 2

## 2017-01-20 MED ORDER — BETAMETHASONE DIPROPIONATE 0.05 % EX OINT: TOPICAL_OINTMENT | Freq: Two times a day (BID) | CUTANEOUS | 0 refills | Status: DC

## 2017-01-20 NOTE — Discharge Instructions (Signed)
Warm compresses 4 times a day. Remove packing in 2 days or may return here for wound check or if worse. Take the doxycycline as directed. Apply the steroid cream to the lichen planus lesions only not to infected lesions.

## 2017-01-20 NOTE — ED Triage Notes (Signed)
Pt c/o dry skin spots all over body, bilateral rashes in armpits, and abscess under R armpit.

## 2017-01-20 NOTE — ED Provider Notes (Signed)
[facility]    CSN: [id number] Arrival date & time: 01/20/17  [id number]     History   Chief Complaint Chief Complaint  Patient presents with  . Rash  . Abscess    HPI Sonya Butler is a 41 y.o. female.   41 year old female with a history of small abscesses in the axilla. States that several weeks ago she had one under the left arm. And now she has one under the right arm. She states she has a "rash" that look like bumps around the upper body and neck.      Past Medical History:  Diagnosis Date  . Medical history non-contributory     There are no active problems to display for this patient.   Past Surgical History:  Procedure Laterality Date  . CHOLECYSTECTOMY    . DILATION AND CURETTAGE OF UTERUS      OB History    Gravida Para Term Preterm AB Living   4 2 2   1 2    SAB TAB Ectopic Multiple Live Births     1     2       Home Medications    Prior to Admission medications   Medication Sig Start Date End Date Taking? Authorizing Provider  betamethasone dipropionate (DIPROLENE) 0.05 % ointment Apply topically 2 (two) times daily. To affected areas of skin. Do not apply to open wounds. 01/20/17   Hayden RasmussenMabe, Rydge [place], NP  doxycycline (VIBRAMYCIN) 100 MG capsule Take 1 capsule (100 mg total) by mouth 2 (two) times daily. 01/20/17   Hayden RasmussenMabe, Fadia Marlar, NP  norgestrel-ethinyl estradiol (LO/OVRAL,CRYSELLE) 0.3-30 MG-MCG tablet Take 1 tablet by mouth daily.    [provider]    Family History Family History  Problem Relation Age of Onset  . Diabetes Mother   . Hypertension Mother   . Thyroid disease Mother   . Colon cancer Father   . Cancer Father   . Breast cancer Paternal Grandmother     Social History Social History   Tobacco Use  . Smoking status: Never Smoker  . Smokeless tobacco: Never Used  Substance Use Topics  . Alcohol use: Yes    Alcohol/week: 0.0 oz    Comment: social  . Drug use: No     Allergies   Patient has no known  allergies.   Review of Systems Review of Systems  Constitutional: Negative.   Gastrointestinal: Negative.   Skin:       As per history of present illness  Hematological: Negative.   All other systems reviewed and are negative.    Physical Exam Triage Vital Signs ED Triage Vitals  Enc Vitals Group     BP 01/20/17 1913 130/80     Pulse Rate 01/20/17 1913 75     Resp 01/20/17 1913 18     Temp 01/20/17 1913 98.6 F (37 C)     Temp src --      SpO2 01/20/17 1913 100 %     Weight --      Height --      Head Circumference --      Peak Flow --      Pain Score 01/20/17 1915 5     Pain Loc --      Pain Edu? --      Excl. in GC? --    No data found.  Updated Vital Signs BP 130/80   Pulse 75   Temp 98.6 F (37 C)   Resp 18  LMP 11/18/2015   SpO2 100%   Visual Acuity Right Eye Distance:   Left Eye Distance:   Bilateral Distance:    Right Eye Near:   Left Eye Near:    Bilateral Near:     Physical Exam  Constitutional: She is oriented to person, place, and time. She appears well-developed and well-nourished. No distress.  Eyes: EOM are normal.  Neck: Normal range of motion. Neck supple.  Cardiovascular: Normal rate.  Pulmonary/Chest: Effort normal. No respiratory distress.  Musculoskeletal: She exhibits no edema.  Neurological: She is alert and oriented to person, place, and time. She exhibits normal muscle tone.  Skin: Skin is warm and dry.  Right axilla with approximately 3 cm x 1 cm raised fluctuant lesion. Positive for tenderness and pain. Both axilla have what appeared to be contact dermatitis type lesions with gray scaly appearance. Appears to be contact dermatitis. On the left there are some open areas which are tender. Probably indicating early infection. No abscess formation left.  Psychiatric: She has a normal mood and affect.  Nursing note and vitals reviewed.    UC Treatments / Results  Labs (all labs ordered are listed, but only abnormal results  are displayed) Labs Reviewed - No data to display  EKG  EKG Interpretation None      Procedure note. The lesion of the right axilla was prepped with Betadine. Anesthetized wit  3.5 cc of 2% Xylocaine a 1 cm incision was made longitudinally over the lesion. Lesion drained a moderate amount of pus followed by blood. The upper wound was then packed with calls. Thick dressing was over applied.  No results found.  Procedures Procedures (including critical care time)  Medications Ordered in UC Medications - No data to display   Initial Impression / Assessment and Plan / UC Course  I have reviewed the triage vital signs and the nursing notes.  Pertinent labs & imaging results that were available during my care of the patient were reviewed by me and considered in my medical decision making (see chart for details).    Warm compresses 4 times a day. Remove packing in 2 days or may return here for wound check or if worse. Take the doxycycline as directed. Apply the steroid cream to the lichen planus lesions only not to infected lesions.     Final Clinical Impressions(s) / UC Diagnoses   Final diagnoses:  Abscess of right axilla  Skin infection  Lichen planus    ED Discharge Orders        Ordered    doxycycline (VIBRAMYCIN) 100 MG capsule  2 times daily     01/20/17 2036    betamethasone dipropionate (DIPROLENE) 0.05 % ointment  2 times daily     01/20/17 2036       Controlled Substance Prescriptions [place] Controlled Substance Registry consulted? Not Applicable   Hayden RasmussenMabe, Preslynn Bier, NP 01/20/17 2044

## 2017-01-23 LAB — AEROBIC CULTURE W GRAM STAIN (SUPERFICIAL SPECIMEN)

## 2017-01-23 LAB — AEROBIC CULTURE  (SUPERFICIAL SPECIMEN)

## 2017-02-26 ENCOUNTER — Telehealth: Payer: Self-pay

## 2017-02-26 DIAGNOSIS — N390 Urinary tract infection, site not specified: Secondary | ICD-10-CM

## 2017-02-26 MED ORDER — SULFAMETHOXAZOLE-TRIMETHOPRIM 800-160 MG PO TABS: 1.0000 | ORAL_TABLET | Freq: Two times a day (BID) | ORAL | 1 refills | Status: DC

## 2017-02-26 NOTE — Telephone Encounter (Signed)
Pt called stating that she has some UTI symptoms. She has burning and pressure with urination. She also complains of having urinary frequency. Rx for bactrim sent to pharmacy

## 2017-04-02 ENCOUNTER — Ambulatory Visit (INDEPENDENT_AMBULATORY_CARE_PROVIDER_SITE_OTHER): Payer: BLUE CROSS/BLUE SHIELD | Admitting: Obstetrics

## 2017-04-02 ENCOUNTER — Other Ambulatory Visit (HOSPITAL_COMMUNITY)
Admission: RE | Admit: 2017-04-02 | Discharge: 2017-04-02 | Disposition: A | Discharge status: Home / Self Care | Payer: BLUE CROSS/BLUE SHIELD | Source: Ambulatory Visit | Attending: Obstetrics | Admitting: Obstetrics

## 2017-04-02 ENCOUNTER — Encounter: Payer: Self-pay | Admitting: Obstetrics

## 2017-04-02 VITALS — BP 133/85 | HR 90 | Ht 63.0 in | Wt 166.6 lb

## 2017-04-02 DIAGNOSIS — B9689 Other specified bacterial agents as the cause of diseases classified elsewhere: Secondary | ICD-10-CM | POA: Insufficient documentation

## 2017-04-02 DIAGNOSIS — A5909 Other urogenital trichomoniasis: Secondary | ICD-10-CM | POA: Insufficient documentation

## 2017-04-02 DIAGNOSIS — R87810 Cervical high risk human papillomavirus (HPV) DNA test positive: Secondary | ICD-10-CM | POA: Diagnosis not present

## 2017-04-02 DIAGNOSIS — N760 Acute vaginitis: Secondary | ICD-10-CM | POA: Diagnosis not present

## 2017-04-02 DIAGNOSIS — Z01411 Encounter for gynecological examination (general) (routine) with abnormal findings: Secondary | ICD-10-CM | POA: Insufficient documentation

## 2017-04-02 DIAGNOSIS — Z01419 Encounter for gynecological examination (general) (routine) without abnormal findings: Secondary | ICD-10-CM

## 2017-04-02 DIAGNOSIS — L309 Dermatitis, unspecified: Secondary | ICD-10-CM

## 2017-04-02 DIAGNOSIS — N898 Other specified noninflammatory disorders of vagina: Secondary | ICD-10-CM | POA: Diagnosis present

## 2017-04-02 MED ORDER — BETAMETHASONE DIPROPIONATE 0.05 % EX OINT: TOPICAL_OINTMENT | Freq: Two times a day (BID) | CUTANEOUS | 2 refills | Status: DC

## 2017-04-02 NOTE — Progress Notes (Signed)
Subjective:        Sonya Butler is a 42 y.o. female here for a routine exam.  Current complaints: None.    Personal health questionnaire:  Is patient [name], have a family history of breast and/or ovarian cancer: no Is there a family history of uterine cancer diagnosed at age < 7550, gastrointestinal cancer, urinary tract cancer, family member who is a Personnel officerLynch syndrome-associated carrier: no Is the patient overweight and hypertensive, family history of diabetes, personal history of gestational diabetes, preeclampsia or PCOS: no Is patient over 2455, have PCOS,  family history of premature CHD under age 42, diabetes, smoke, have hypertension or peripheral artery disease:  no At any time, has a partner hit, kicked or otherwise hurt or frightened you?: no Over the past 2 weeks, have you felt down, depressed or hopeless?: no Over the past 2 weeks, have you felt little interest or pleasure in doing things?:no   Gynecologic History Patient's last menstrual period was 03/18/2017. Contraception: OCP (estrogen/progesterone) Last Pap: 2017. Results were: normal Last mammogram: n/a. Results were: n/a  Obstetric History OB History  Gravida Para Term Preterm AB Living  4 2 2   1 2   SAB TAB Ectopic Multiple Live Births    1     2    # Outcome Date GA Lbr Len/2nd Weight Sex Delivery Anes PTL Lv  4 Gravida           3 TAB 2015        DEC  2 Term 12/27/04 1851w0d  7 lb 4 oz (3.289 kg) M Vag-Spont EPI  LIV  1 Term 05/03/92   7 lb 9 oz (3.43 kg) M Vag-Spont EPI  LIV      Past Medical History:  Diagnosis Date  . Medical history non-contributory     Past Surgical History:  Procedure Laterality Date  . CHOLECYSTECTOMY    . DILATION AND CURETTAGE OF UTERUS       Current Outpatient Medications:  .  betamethasone dipropionate (DIPROLENE) 0.05 % ointment, Apply topically 2 (two) times daily. To affected areas of skin. Do not apply to open wounds., Disp: 45 g, Rfl: 2 .  LO LOESTRIN FE  1 MG-10 MCG / 10 MCG tablet, Take 1 tablet by mouth daily., Disp: , Rfl: 4 No Known Allergies  Social History   Tobacco Use  . Smoking status: Never Smoker  . Smokeless tobacco: Never Used  Substance Use Topics  . Alcohol use: Yes    Alcohol/week: 0.0 oz    Comment: social    Family History  Problem Relation Age of Onset  . Diabetes Mother   . Hypertension Mother   . Thyroid disease Mother   . Colon cancer Father   . Cancer Father   . Breast cancer Paternal Grandmother       Review of Systems  Constitutional: negative for fatigue and weight loss Respiratory: negative for cough and wheezing Cardiovascular: negative for chest pain, fatigue and palpitations Gastrointestinal: negative for abdominal pain and change in bowel habits Musculoskeletal:negative for myalgias Neurological: negative for gait problems and tremors Behavioral/Psych: negative for abusive relationship, depression Endocrine: negative for temperature intolerance    Genitourinary:negative for abnormal menstrual periods, genital lesions, hot flashes, sexual problems and vaginal discharge Integument/breast: negative for breast lump, breast tenderness, nipple discharge and skin lesion(s)    Objective:       BP 133/85   Pulse 90   Ht 5\' 3"  (1.6 m)   Wt  166 lb 9.6 oz (75.6 kg)   LMP 03/18/2017   BMI 29.51 kg/m  General:   alert  Skin:   no rash or abnormalities  Lungs:   clear to auscultation bilaterally  Heart:   regular rate and rhythm, S1, S2 normal, no murmur, click, rub or gallop  Breasts:   normal without suspicious masses, skin or nipple changes or axillary nodes  Abdomen:  normal findings: no organomegaly, soft, non-tender and no hernia  Pelvis:  External genitalia: normal general appearance Urinary system: urethral meatus normal and bladder without fullness, nontender Vaginal: normal without tenderness, induration or masses Cervix: normal appearance Adnexa: normal bimanual exam Uterus:  anteverted and non-tender, normal size   Lab Review Urine pregnancy test Labs reviewed yes Radiologic studies reviewed no  50% of 20 min visit spent on counseling and coordination of care.   Assessment and Plan:     1. Encounter for gynecological examination with Papanicolaou smear of cervix Rx: - Cytology - PAPx:  2. Vaginal discharge R - Cervicovaginal ancillary only  3. Eczema, unspecified type Rx: - betamethasone dipropionate (DIPROLENE) 0.05 % ointment; Apply topically 2 (two) times daily. To affected areas of skin. Do not apply to open wounds.  Dispense: 45 g; Refill: 2   Plan:    Education reviewed: calcium supplements, depression evaluation, low fat, low cholesterol diet, safe sex/STD prevention, self breast exams and weight bearing exercise. Contraception: OCP (estrogen/progesterone). Follow up in: 1 year.   Meds ordered this encounter  Medications  . betamethasone dipropionate (DIPROLENE) 0.05 % ointment    Sig: Apply topically 2 (two) times daily. To affected areas of skin. Do not apply to open wounds.    Dispense:  45 g    Refill:  2   No orders of the defined types were placed in this encounter.   Brock Bad MD

## 2017-04-03 LAB — CERVICOVAGINAL ANCILLARY ONLY
BACTERIAL VAGINITIS: POSITIVE — AB
Candida vaginitis: NEGATIVE
Chlamydia: NEGATIVE
Neisseria Gonorrhea: NEGATIVE
Trichomonas: POSITIVE — AB

## 2017-04-04 ENCOUNTER — Other Ambulatory Visit: Payer: Self-pay | Admitting: Obstetrics

## 2017-04-04 DIAGNOSIS — N760 Acute vaginitis: Secondary | ICD-10-CM

## 2017-04-04 DIAGNOSIS — B9689 Other specified bacterial agents as the cause of diseases classified elsewhere: Secondary | ICD-10-CM

## 2017-04-04 DIAGNOSIS — A5901 Trichomonal vulvovaginitis: Secondary | ICD-10-CM

## 2017-04-04 MED ORDER — TINIDAZOLE 500 MG PO TABS: 2.0000 g | ORAL_TABLET | Freq: Every day | ORAL | 0 refills | Status: DC

## 2017-04-08 LAB — CYTOLOGY - PAP
Diagnosis: NEGATIVE
HPV 16/18/45 genotyping: NEGATIVE
HPV: DETECTED — AB

## 2017-06-05 ENCOUNTER — Other Ambulatory Visit: Payer: Self-pay

## 2017-06-05 DIAGNOSIS — N760 Acute vaginitis: Secondary | ICD-10-CM

## 2017-06-05 DIAGNOSIS — B9689 Other specified bacterial agents as the cause of diseases classified elsewhere: Secondary | ICD-10-CM

## 2017-06-05 DIAGNOSIS — A5901 Trichomonal vulvovaginitis: Secondary | ICD-10-CM

## 2017-06-05 MED ORDER — TINIDAZOLE 500 MG PO TABS: 2.0000 g | ORAL_TABLET | Freq: Every day | ORAL | 0 refills | Status: DC

## 2017-06-05 NOTE — Progress Notes (Signed)
Pt called c/o recent abx use and now has white vaginal discharge with fishy odor, denies itching. Tindamax sent to pharmacy per protocol.

## 2017-08-11 ENCOUNTER — Other Ambulatory Visit: Payer: Self-pay | Admitting: Obstetrics

## 2017-08-11 ENCOUNTER — Telehealth: Payer: Self-pay

## 2017-08-11 DIAGNOSIS — N760 Acute vaginitis: Secondary | ICD-10-CM

## 2017-08-11 DIAGNOSIS — A5901 Trichomonal vulvovaginitis: Secondary | ICD-10-CM

## 2017-08-11 DIAGNOSIS — B9689 Other specified bacterial agents as the cause of diseases classified elsewhere: Secondary | ICD-10-CM

## 2017-08-11 MED ORDER — TINIDAZOLE 500 MG PO TABS: 2.0000 g | ORAL_TABLET | Freq: Every day | ORAL | 2 refills | Status: DC

## 2017-08-11 NOTE — Telephone Encounter (Signed)
Returned call and pt stated that she is having thick vaginal discharge with a slight odor. Pt states that she previously had rx for BV for 2 day treatment, but the pharmacy told her to come back and get the remaining pills to take the next day, and they did not have them for 3 days, causing a gap in her treatment, routed to provider for review.

## 2017-08-12 ENCOUNTER — Telehealth: Payer: Self-pay

## 2017-08-12 NOTE — Telephone Encounter (Signed)
Spoke with patient and advised that provider sent rx.

## 2017-09-01 ENCOUNTER — Other Ambulatory Visit: Payer: Self-pay

## 2017-09-01 ENCOUNTER — Encounter (HOSPITAL_COMMUNITY): Payer: Self-pay | Admitting: Emergency Medicine

## 2017-09-01 ENCOUNTER — Ambulatory Visit (HOSPITAL_COMMUNITY)
Admission: EM | Admit: 2017-09-01 | Discharge: 2017-09-01 | Disposition: A | Discharge status: Home / Self Care | Payer: BLUE CROSS/BLUE SHIELD | Attending: Family Medicine | Admitting: Family Medicine

## 2017-09-01 DIAGNOSIS — J020 Streptococcal pharyngitis: Secondary | ICD-10-CM

## 2017-09-01 DIAGNOSIS — J029 Acute pharyngitis, unspecified: Secondary | ICD-10-CM

## 2017-09-01 DIAGNOSIS — M791 Myalgia, unspecified site: Secondary | ICD-10-CM

## 2017-09-01 DIAGNOSIS — R509 Fever, unspecified: Secondary | ICD-10-CM

## 2017-09-01 LAB — POCT RAPID STREP A: Streptococcus, Group A Screen (Direct): POSITIVE — AB

## 2017-09-01 MED ORDER — LIDOCAINE VISCOUS HCL 2 % MT SOLN: 15.0000 mL | OROMUCOSAL | 0 refills | Status: DC | PRN

## 2017-09-01 MED ORDER — PENICILLIN G BENZATHINE 1200000 UNIT/2ML IM SUSP
INTRAMUSCULAR | Status: AC
  Filled 2017-09-01: qty 2

## 2017-09-01 MED ORDER — AMOXICILLIN 500 MG PO CAPS: 1000.0000 mg | ORAL_CAPSULE | Freq: Two times a day (BID) | ORAL | 0 refills | Status: DC

## 2017-09-01 MED ORDER — PENICILLIN G BENZATHINE 1200000 UNIT/2ML IM SUSP
1.2000 10*6.[IU] | Freq: Once | INTRAMUSCULAR | Status: AC
  Administered 2017-09-01: 1.2 10*6.[IU] via INTRAMUSCULAR

## 2017-09-01 NOTE — ED Notes (Signed)
Bed: UC01 Expected date:  Expected time:  Means of arrival:  Comments: Appointments 

## 2017-09-01 NOTE — ED Triage Notes (Signed)
Sore throat and aching since Saturday.  Fever unknown

## 2017-09-01 NOTE — ED Provider Notes (Addendum)
[facility]    CSN: [id number] Arrival date & time: 09/01/17  1021     History   Chief Complaint Chief Complaint  Patient presents with  . Appointment    10:30  . Sore Throat    HPI Sonya Butler is a 42 y.o. female.   Patient is a healthy 42 year old female that presents with 2 days of sore throat.  This has worsened over the last 2 days.  She has been having fever, body aches, trouble swallowing.  She denies any cough, congestion, headaches,  runny nose, ear pain, sick contacts. She denies any N,V but reports slight diarrhea.  She took ibuprofen at 4 AM.   ROS per HPI      Past Medical History:  Diagnosis Date  . Medical history non-contributory     There are no active problems to display for this patient.   Past Surgical History:  Procedure Laterality Date  . CHOLECYSTECTOMY    . DILATION AND CURETTAGE OF UTERUS      OB History    Gravida  4   Para  2   Term  2   Preterm      AB  1   Living  2     SAB      TAB  1   Ectopic      Multiple      Live Births  2            Home Medications    Prior to Admission medications   Medication Sig Start Date End Date Taking? Authorizing Provider  betamethasone dipropionate (DIPROLENE) 0.05 % ointment Apply topically 2 (two) times daily. To affected areas of skin. Do not apply to open wounds. 04/02/17   Brock Bad, MD  lidocaine (XYLOCAINE) 2 % solution Use as directed 15 mLs in the mouth or throat as needed for mouth pain. 09/01/17   Tangela Dolliver A, NP  LO LOESTRIN FE 1 MG-10 MCG / 10 MCG tablet Take 1 tablet by mouth daily. 01/05/17   [provider]  tinidazole (TINDAMAX) 500 MG tablet Take 4 tablets (2,000 mg total) by mouth daily with breakfast. For two days 06/05/17   Brock Bad, MD  tinidazole [facility]) 500 MG tablet Take 4 tablets (2,000 mg total) by mouth daily with breakfast. 08/11/17   Brock Bad, MD    Family History Family History  Problem  Relation Age of Onset  . Diabetes Mother   . Hypertension Mother   . Thyroid disease Mother   . Colon cancer Father   . Cancer Father   . Breast cancer Paternal Grandmother     Social History Social History   Tobacco Use  . Smoking status: Never Smoker  . Smokeless tobacco: Never Used  Substance Use Topics  . Alcohol use: Yes    Alcohol/week: 0.0 oz    Comment: social  . Drug use: No     Allergies   Patient has no known allergies.   Review of Systems Review of Systems   Physical Exam Triage Vital Signs ED Triage Vitals  Enc Vitals Group     BP 09/01/17 1029 127/70     Pulse Rate 09/01/17 1029 (!) 110     Resp 09/01/17 1029 16     Temp 09/01/17 1029 99.7 F (37.6 C)     Temp Source 09/01/17 1029 Oral     SpO2 09/01/17 1029 99 %     Weight --  Height --      Head Circumference --      Peak Flow --      Pain Score 09/01/17 1035 10     Pain Loc --      Pain Edu? --      Excl. in GC? --    No data found.  Updated Vital Signs BP 127/70 (BP Location: Right Arm)   Pulse (!) 110   Temp 99.7 F (37.6 C) (Oral)   Resp 16   SpO2 99%   Visual Acuity Right Eye Distance:   Left Eye Distance:   Bilateral Distance:    Right Eye Near:   Left Eye Near:    Bilateral Near:     Physical Exam  Constitutional: She is oriented to person, place, and time. She appears well-developed. She appears ill.  HENT:  Head: Normocephalic and atraumatic.  Right Ear: Hearing and tympanic membrane normal. No drainage, swelling or tenderness. No middle ear effusion.  Left Ear: Hearing and tympanic membrane normal. No drainage, swelling or tenderness.  No middle ear effusion.  Mouth/Throat: Mucous membranes are normal. Oropharyngeal exudate present. Tonsils are 3+ on the right. Tonsils are 3+ on the left. Tonsillar exudate.  3+ tonsillar swelling with exudate and erythema.   Eyes: Pupils are equal, round, and reactive to light.  Cardiovascular: Regular rhythm.  Slightly  tachycardic  Pulmonary/Chest: Effort normal and breath sounds normal.  Lymphadenopathy:    She has cervical adenopathy.  Neurological: She is alert and oriented to person, place, and time.  Skin: Skin is warm and dry. Capillary refill takes less than 2 seconds.  Psychiatric: She has a normal mood and affect. Her behavior is normal.  Nursing note and vitals reviewed.    UC Treatments / Results  Labs (all labs ordered are listed, but only abnormal results are displayed) Labs Reviewed  POCT RAPID STREP A - Abnormal; Notable for the following components:      Result Value   Streptococcus, Group A Screen (Direct) POSITIVE (*)    All other components within normal limits    EKG None  Radiology No results found.  Procedures Procedures (including critical care time)  Medications Ordered in UC Medications  penicillin g benzathine (BICILLIN LA) 1200000 UNIT/2ML injection 1.2 Million Units (1.2 Million Units Intramuscular Given 09/01/17 1110)    Initial Impression / Assessment and Plan / UC Course  I have reviewed the triage vital signs and the nursing notes.  Pertinent labs & imaging results that were available during my care of the patient were reviewed by me and considered in my medical decision making (see chart for details).     Strep test positive.  Due to patient's tonsilar size offered penicillin shot in clinic.  Patient agreed.  Lidocaine to help with pain and trouble swallowing.  Ibuprofen as needed.  Return precautions given. Final Clinical Impressions(s) / UC Diagnoses   Final diagnoses:  Strep pharyngitis     Discharge Instructions     It was nice meeting you!!  Your rapid strep test was positive.  We are giving you an injection in the clinic to treat the infection.  You may continue ibuprofen as needed fever, pain and try warm salt gargles for throat.  I am also giving you some lidocaine to help with pain in the throat. Wait 30-45 minutes before eating or  drinking after using this.  Return as needed if symptoms worsen. If you develop severe neck swelling, trouble breathing, please go to the  ER.     ED Prescriptions    Medication Sig Dispense Auth. Provider   amoxicillin (AMOXIL) 500 MG capsule  (Status: Discontinued) Take 2 capsules (1,000 mg total) by mouth 2 (two) times daily. 40 capsule Ricka Westra A, NP   lidocaine (XYLOCAINE) 2 % solution Use as directed 15 mLs in the mouth or throat as needed for mouth pain. 100 mL Dahlia Byes A, NP     Controlled Substance Prescriptions [place] Controlled Substance Registry consulted? Not Applicable   Janace Aris, NP 09/01/17 1118    Dahlia Byes A, NP 09/01/17 1210

## 2017-09-01 NOTE — Discharge Instructions (Addendum)
It was nice meeting you!!  Your rapid strep test was positive.  We are giving you an injection in the clinic to treat the infection.  You may continue ibuprofen as needed fever, pain and try warm salt gargles for throat.  I am also giving you some lidocaine to help with pain in the throat. Wait 30-45 minutes before eating or drinking after using this.  Return as needed if symptoms worsen. If you develop severe neck swelling, trouble breathing, please go to the ER.

## 2017-12-04 ENCOUNTER — Telehealth: Payer: Self-pay

## 2017-12-04 NOTE — Telephone Encounter (Signed)
Pt calling w/ BV sx's x 2 wks now  AEX not due until  03/2018 Pt requesting Rx that is not on nurse/triage protocol  Tindima 500mg  to [facility] on [address]

## 2017-12-05 ENCOUNTER — Other Ambulatory Visit: Payer: Self-pay | Admitting: Obstetrics

## 2017-12-05 DIAGNOSIS — N760 Acute vaginitis: Secondary | ICD-10-CM

## 2017-12-05 DIAGNOSIS — B9689 Other specified bacterial agents as the cause of diseases classified elsewhere: Secondary | ICD-10-CM

## 2017-12-05 MED ORDER — TINIDAZOLE 500 MG PO TABS: 1000.0000 mg | ORAL_TABLET | Freq: Every day | ORAL | 2 refills | Status: DC

## 2017-12-05 NOTE — Telephone Encounter (Signed)
Tinidazole Rx 

## 2018-10-28 ENCOUNTER — Other Ambulatory Visit: Payer: Self-pay

## 2018-10-28 ENCOUNTER — Telehealth: Payer: Self-pay

## 2018-10-28 DIAGNOSIS — N760 Acute vaginitis: Secondary | ICD-10-CM

## 2018-10-28 MED ORDER — METRONIDAZOLE 500 MG PO TABS: 500.0000 mg | ORAL_TABLET | Freq: Two times a day (BID) | ORAL | 0 refills | Status: DC

## 2018-10-28 NOTE — Telephone Encounter (Signed)
Pt called with symptoms of BV for about a week, discharge and odor. I advised pt that I can send her medication per protocol to her pharmacy and for her to let us know if her symptoms do not improve. Pt verbalizes understanding.

## 2018-10-31 ENCOUNTER — Emergency Department (HOSPITAL_COMMUNITY): Payer: BC Managed Care – PPO

## 2018-10-31 ENCOUNTER — Encounter (HOSPITAL_COMMUNITY): Payer: Self-pay | Admitting: *Deleted

## 2018-10-31 ENCOUNTER — Other Ambulatory Visit: Payer: Self-pay

## 2018-10-31 ENCOUNTER — Emergency Department (HOSPITAL_COMMUNITY)
Admission: EM | Admit: 2018-10-31 | Discharge: 2018-11-01 | Disposition: A | Discharge status: Home / Self Care | Payer: BC Managed Care – PPO | Attending: Emergency Medicine | Admitting: Emergency Medicine

## 2018-10-31 DIAGNOSIS — Z79899 Other long term (current) drug therapy: Secondary | ICD-10-CM | POA: Insufficient documentation

## 2018-10-31 DIAGNOSIS — M25512 Pain in left shoulder: Secondary | ICD-10-CM | POA: Insufficient documentation

## 2018-10-31 DIAGNOSIS — Y92410 Unspecified street and highway as the place of occurrence of the external cause: Secondary | ICD-10-CM | POA: Diagnosis not present

## 2018-10-31 DIAGNOSIS — R0789 Other chest pain: Secondary | ICD-10-CM | POA: Diagnosis not present

## 2018-10-31 DIAGNOSIS — Y999 Unspecified external cause status: Secondary | ICD-10-CM | POA: Insufficient documentation

## 2018-10-31 DIAGNOSIS — Y93I9 Activity, other involving external motion: Secondary | ICD-10-CM | POA: Diagnosis not present

## 2018-10-31 DIAGNOSIS — M79632 Pain in left forearm: Secondary | ICD-10-CM | POA: Diagnosis not present

## 2018-10-31 HISTORY — DX: Other specified congenital malformations of skin: Q82.8

## 2018-10-31 LAB — POC URINE PREG, ED: Preg Test, Ur: NEGATIVE

## 2018-10-31 MED ORDER — ACETAMINOPHEN 325 MG PO TABS
650.0000 mg | ORAL_TABLET | Freq: Once | ORAL | Status: AC
  Administered 2018-10-31: 650 mg via ORAL
  Filled 2018-10-31: qty 2

## 2018-10-31 MED ORDER — METHOCARBAMOL 500 MG PO TABS
500.0000 mg | ORAL_TABLET | Freq: Once | ORAL | Status: AC
  Administered 2018-10-31: 500 mg via ORAL
  Filled 2018-10-31: qty 1
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
## 2018-10-31 NOTE — ED Notes (Signed)
Patient transported to X-ray 

## 2018-11-01 MED ORDER — NAPROXEN 500 MG PO TABS: 500.0000 mg | ORAL_TABLET | Freq: Two times a day (BID) | ORAL | 0 refills | Status: DC

## 2018-11-01 MED ORDER — METHOCARBAMOL 500 MG PO TABS: 500.0000 mg | ORAL_TABLET | Freq: Three times a day (TID) | ORAL | 0 refills | Status: DC | PRN
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
## 2018-11-18 ENCOUNTER — Other Ambulatory Visit: Payer: Self-pay | Admitting: Obstetrics

## 2018-12-07 ENCOUNTER — Ambulatory Visit (INDEPENDENT_AMBULATORY_CARE_PROVIDER_SITE_OTHER): Payer: BC Managed Care – PPO | Admitting: Obstetrics

## 2018-12-07 ENCOUNTER — Other Ambulatory Visit: Payer: Self-pay

## 2018-12-07 ENCOUNTER — Encounter: Payer: Self-pay | Admitting: Obstetrics

## 2018-12-07 ENCOUNTER — Other Ambulatory Visit (HOSPITAL_COMMUNITY)
Admission: RE | Admit: 2018-12-07 | Discharge: 2018-12-07 | Disposition: A | Discharge status: Home / Self Care | Payer: BC Managed Care – PPO | Source: Ambulatory Visit | Attending: Obstetrics | Admitting: Obstetrics

## 2018-12-07 ENCOUNTER — Other Ambulatory Visit: Payer: Self-pay | Admitting: Obstetrics

## 2018-12-07 VITALS — BP 129/90 | HR 79 | Ht 63.0 in | Wt 161.0 lb

## 2018-12-07 DIAGNOSIS — N946 Dysmenorrhea, unspecified: Secondary | ICD-10-CM

## 2018-12-07 DIAGNOSIS — N898 Other specified noninflammatory disorders of vagina: Secondary | ICD-10-CM | POA: Insufficient documentation

## 2018-12-07 DIAGNOSIS — Z01419 Encounter for gynecological examination (general) (routine) without abnormal findings: Secondary | ICD-10-CM | POA: Diagnosis present

## 2018-12-07 DIAGNOSIS — Z3041 Encounter for surveillance of contraceptive pills: Secondary | ICD-10-CM

## 2018-12-07 DIAGNOSIS — N6325 Unspecified lump in the left breast, overlapping quadrants: Secondary | ICD-10-CM

## 2018-12-07 DIAGNOSIS — Q828 Other specified congenital malformations of skin: Secondary | ICD-10-CM

## 2018-12-07 MED ORDER — LO LOESTRIN FE 1 MG-10 MCG / 10 MCG PO TABS: 1.0000 | ORAL_TABLET | Freq: Every day | ORAL | 4 refills | Status: DC

## 2018-12-07 MED ORDER — NAPROXEN 500 MG PO TABS: 500.0000 mg | ORAL_TABLET | Freq: Two times a day (BID) | ORAL | 5 refills | Status: DC

## 2018-12-07 NOTE — Progress Notes (Signed)
Subjective:        Sonya Butler is a 43 y.o. female here for a routine exam.  Current complaints: Lump in left breast that occurred with last period but went away.    Personal health questionnaire:  Is patient [name], have a family history of breast and/or ovarian cancer: no Is there a family history of uterine cancer diagnosed at age < 40, gastrointestinal cancer, urinary tract cancer, family member who is a Personnel officer syndrome-associated carrier: no Is the patient overweight and hypertensive, family history of diabetes, personal history of gestational diabetes, preeclampsia or PCOS: yes Is patient over 53, have PCOS,  family history of premature CHD under age 54, diabetes, smoke, have hypertension or peripheral artery disease:  no At any time, has a partner hit, kicked or otherwise hurt or frightened you?: no Over the past 2 weeks, have you felt down, depressed or hopeless?: no Over the past 2 weeks, have you felt little interest or pleasure in doing things?:no   Gynecologic History Patient's last menstrual period was 11/26/2018. Contraception: OCP (estrogen/progesterone) Last Pap: 04-02-2017. Results were: NILM but positive low risk HPV Last mammogram: 01-02-2017. Results were: normal  Obstetric History OB History  Gravida Para Term Preterm AB Living  4 2 2   1 2   SAB TAB Ectopic Multiple Live Births    1     2    # Outcome Date GA Lbr Len/2nd Weight Sex Delivery Anes PTL Lv  4 Gravida           3 TAB 2015        DEC  2 Term 12/27/04 [redacted]w[redacted]d  7 lb 4 oz (3.289 kg) M Vag-Spont EPI  LIV  1 Term 05/03/92   7 lb 9 oz (3.43 kg) M Vag-Spont EPI  LIV    Past Medical History:  Diagnosis Date  . Hailey-hailey disease   . Medical history non-contributory     Past Surgical History:  Procedure Laterality Date  . CHOLECYSTECTOMY    . DILATION AND CURETTAGE OF UTERUS       Current Outpatient Medications:  .  glycopyrrolate (ROBINUL) 1 MG tablet, Take 1 mg by mouth 3  (three) times daily., Disp: , Rfl:  .  naltrexone (DEPADE) 50 MG tablet, Take 3 mg by mouth daily., Disp: , Rfl:  .  betamethasone dipropionate (DIPROLENE) 0.05 % ointment, Apply topically 2 (two) times daily. To affected areas of skin. Do not apply to open wounds., Disp: 45 g, Rfl: 2 .  lidocaine (XYLOCAINE) 2 % solution, Use as directed 15 mLs in the mouth or throat as needed for mouth pain., Disp: 100 mL, Rfl: 0 .  LO LOESTRIN FE 1 MG-10 MCG / 10 MCG tablet, Take 1 tablet by mouth daily., Disp: 1 Package, Rfl: 4 .  naproxen (NAPROSYN) 500 MG tablet, Take 1 tablet (500 mg total) by mouth 2 (two) times daily with a meal., Disp: 30 tablet, Rfl: 5 No Known Allergies  Social History   Tobacco Use  . Smoking status: Never Smoker  . Smokeless tobacco: Never Used  Substance Use Topics  . Alcohol use: Yes    Alcohol/week: 0.0 standard drinks    Comment: social    Family History  Problem Relation Age of Onset  . Diabetes Mother   . Hypertension Mother   . Thyroid disease Mother   . Colon cancer Father   . Cancer Father   . Breast cancer Paternal Grandmother  Review of Systems  Constitutional: negative for fatigue and weight loss Respiratory: negative for cough and wheezing Cardiovascular: negative for chest pain, fatigue and palpitations Gastrointestinal: negative for abdominal pain and change in bowel habits Musculoskeletal:negative for myalgias Neurological: negative for gait problems and tremors Behavioral/Psych: negative for abusive relationship, depression Endocrine: negative for temperature intolerance    Genitourinary:negative for abnormal menstrual periods, genital lesions, hot flashes, sexual problems and vaginal discharge Integument/breast: negative for breast lump, breast tenderness, nipple discharge and skin lesion(s)    Objective:       BP 129/90   Pulse 79   Ht 5\' 3"  (1.6 m)   Wt 161 lb (73 kg)   LMP 11/26/2018   BMI 28.52 kg/m  General:   alert  Skin:    no rash or abnormalities  Lungs:   clear to auscultation bilaterally  Heart:   regular rate and rhythm, S1, S2 normal, no murmur, click, rub or gallop  Breasts:   normal without suspicious masses, skin or nipple changes or axillary nodes  Abdomen:  normal findings: no organomegaly, soft, non-tender and no hernia  Pelvis:  External genitalia: normal general appearance Urinary system: urethral meatus normal and bladder without fullness, nontender Vaginal: normal without tenderness, induration or masses Cervix: normal appearance Adnexa: normal bimanual exam Uterus: anteverted and non-tender, normal size   Lab Review Urine pregnancy test Labs reviewed yes Radiologic studies reviewed yes  50% of 25 min visit spent on counseling and coordination of care.   Assessment:     1. Encounter for routine gynecological examination with Papanicolaou smear of cervix Rx: - Cytology - PAP( [place])  2. Vaginal discharge Rx: - Cervicovaginal ancillary only( )  3. Encounter for surveillance of contraceptive pills Rx: - LO LOESTRIN FE 1 MG-10 MCG / 10 MCG tablet; Take 1 tablet by mouth daily.  Dispense: 1 Package; Refill: 4  4. Breast lump on left side at 3 o'clock position Rx: - MM Digital Screening; Future  5. Dysmenorrhea Rx_ - naproxen (NAPROSYN) 500 MG tablet; Take 1 tablet (500 mg total) by mouth 2 (two) times daily with a meal.  Dispense: 30 tablet; Refill: 5  6. 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 Contraception: OCP (estrogen/progesterone). Mammogram ordered. Follow up in: 1 year.   Meds ordered this encounter  Medications  . LO LOESTRIN FE 1 MG-10 MCG / 10 MCG tablet    Sig: Take 1 tablet by mouth daily.    Dispense:  1  Package    Refill:  4  . naproxen (NAPROSYN) 500 MG tablet    Sig: Take 1 tablet (500 mg total) by mouth 2 (two) times daily with a meal.    Dispense:  30 tablet    Refill:  5   Orders Placed This Encounter  Procedures  . MM Digital Screening    Standing Status:   Future    Standing Expiration Date:   02/06/2020    Order Specific Question:   Reason for Exam (SYMPTOM  OR DIAGNOSIS REQUIRED)    Answer:   Screening.  Breast lump.    Order Specific Question:   Is the patient pregnant?    Answer:   No    Order Specific Question:   Preferred imaging location?    Answer[facility]  [facility]    Brock BadHarper, Kenny Rea A, MD 12/07/2018 11:40 AM

## 2018-12-10 ENCOUNTER — Other Ambulatory Visit: Payer: Self-pay

## 2018-12-10 ENCOUNTER — Ambulatory Visit
Admission: RE | Admit: 2018-12-10 | Discharge: 2018-12-10 | Disposition: A | Discharge status: Home / Self Care | Payer: BC Managed Care – PPO | Source: Ambulatory Visit | Attending: Obstetrics | Admitting: Obstetrics

## 2018-12-10 DIAGNOSIS — N6325 Unspecified lump in the left breast, overlapping quadrants: Secondary | ICD-10-CM

## 2018-12-10 LAB — CERVICOVAGINAL ANCILLARY ONLY
Bacterial Vaginitis (gardnerella): POSITIVE — AB
Candida Glabrata: NEGATIVE
Candida Vaginitis: NEGATIVE
Chlamydia: NEGATIVE
Comment: NEGATIVE
Comment: NEGATIVE
Comment: NEGATIVE
Comment: NEGATIVE
Comment: NEGATIVE
Comment: NORMAL
Neisseria Gonorrhea: NEGATIVE
Trichomonas: NEGATIVE

## 2018-12-10 LAB — CYTOLOGY - PAP
Comment: NEGATIVE
Diagnosis: NEGATIVE
High risk HPV: NEGATIVE

## 2018-12-11 ENCOUNTER — Other Ambulatory Visit: Payer: Self-pay | Admitting: Obstetrics

## 2018-12-11 ENCOUNTER — Telehealth: Payer: Self-pay

## 2018-12-11 DIAGNOSIS — B9689 Other specified bacterial agents as the cause of diseases classified elsewhere: Secondary | ICD-10-CM

## 2018-12-11 DIAGNOSIS — N760 Acute vaginitis: Secondary | ICD-10-CM

## 2018-12-11 MED ORDER — METRONIDAZOLE 500 MG PO TABS: 500.0000 mg | ORAL_TABLET | Freq: Two times a day (BID) | ORAL | 2 refills | Status: DC

## 2018-12-11 NOTE — Progress Notes (Signed)
Patient notified of results and RX 

## 2018-12-11 NOTE — Telephone Encounter (Signed)
-----   Message from Shelly Bombard, MD sent at 12/11/2018  8:25 AM EDT ----- Flagyl Rx for BV

## 2018-12-11 NOTE — Telephone Encounter (Signed)
Patient notified of results and RX 

## 2019-05-02 ENCOUNTER — Other Ambulatory Visit: Payer: Self-pay | Admitting: Obstetrics

## 2019-05-02 DIAGNOSIS — Z3041 Encounter for surveillance of contraceptive pills: Secondary | ICD-10-CM

## 2019-09-17 ENCOUNTER — Ambulatory Visit
Admission: EM | Admit: 2019-09-17 | Discharge: 2019-09-17 | Disposition: A | Discharge status: Home / Self Care | Payer: BC Managed Care – PPO | Attending: Emergency Medicine | Admitting: Emergency Medicine

## 2019-09-17 ENCOUNTER — Other Ambulatory Visit: Payer: Self-pay

## 2019-09-17 DIAGNOSIS — L03115 Cellulitis of right lower limb: Secondary | ICD-10-CM | POA: Diagnosis not present

## 2019-09-17 MED ORDER — DOXYCYCLINE HYCLATE 100 MG PO CAPS: 100.0000 mg | ORAL_CAPSULE | Freq: Two times a day (BID) | ORAL | 0 refills | Status: AC

## 2019-09-17 MED ORDER — FLUCONAZOLE 200 MG PO TABS: 200.0000 mg | ORAL_TABLET | Freq: Once | ORAL | 0 refills | Status: AC

## 2019-09-17 NOTE — ED Triage Notes (Signed)
Pt c/o spider bite to rt upper thigh Sunday. States was a blister that popped on its on. States now red, hot, and tender.

## 2019-09-17 NOTE — Discharge Instructions (Signed)
Keep area(s) clean and dry. °Apply hot compress / towel for 5-10 minutes 3-5 times daily. °Take antibiotic as prescribed with food - important to complete course. °Return for worsening pain, redness, swelling, discharge, fever. ° °Helpful prevention tips: °Keep nails short to avoid secondary skin infections. °Use new, clean razors when shaving. °Avoid antiperspirants - look for deodorants without aluminum. °Avoid wearing underwire bras as this can irritate the area further.  °

## 2019-09-17 NOTE — ED Provider Notes (Signed)
[facility]    CSN: [id number] Arrival date & time: 09/17/19  1911      History   Chief Complaint Chief Complaint  Patient presents with  . Insect Bite    HPI Sonya Butler is a 44 y.o. female presenting for spider bite to right upper thigh.  States this occurred Sunday.  States she noted a blister that popped on its own when rubbing against her jeans.  Tetanus up-to-date.  States over the last few days she has developed red, firmness, warmth, and tenderness.  Denies fever, chills, throbs, myalgias.   Past Medical History:  Diagnosis Date  . Hailey-hailey disease   . Medical history non-contributory     There are no problems to display for this patient.   Past Surgical History:  Procedure Laterality Date  . CHOLECYSTECTOMY    . DILATION AND CURETTAGE OF UTERUS      OB History    Gravida  4   Para  2   Term  2   Preterm      AB  1   Living  2     SAB      TAB  1   Ectopic      Multiple      Live Births  2            Home Medications    Prior to Admission medications   Medication Sig Start Date End Date Taking? Authorizing Provider  doxycycline (VIBRAMYCIN) 100 MG capsule Take 1 capsule (100 mg total) by mouth 2 (two) times daily for 7 days. 09/17/19 09/24/19  Hall-Potvin, [place], PA-C  glycopyrrolate (ROBINUL) 1 MG tablet Take 1 mg by mouth 3 (three) times daily.    [provider]  LO LOESTRIN FE 1 MG-10 MCG / 10 MCG tablet TAKE 1 TABLET BY MOUTH EVERY DAY 05/03/19   Brock Bad, MD  naltrexone (DEPADE) 50 MG tablet Take 3 mg by mouth daily.    [provider]    Family History Family History  Problem Relation Age of Onset  . Diabetes Mother   . Hypertension Mother   . Thyroid disease Mother   . Colon cancer Father   . Cancer Father   . Breast cancer Paternal Grandmother     Social History Social History   Tobacco Use  . Smoking status: Never Smoker  . Smokeless tobacco: Never Used  Vaping  Use  . Vaping Use: Never used  Substance Use Topics  . Alcohol use: Yes    Alcohol/week: 0.0 standard drinks    Comment: social  . Drug use: No     Allergies   Patient has no known allergies.   Review of Systems As per HPI   Physical Exam Triage Vital Signs ED Triage Vitals  Enc Vitals Group     BP 09/17/19 1921 (!) 153/89     Pulse Rate 09/17/19 1921 80     Resp 09/17/19 1921 16     Temp 09/17/19 1921 98.3 F (36.8 C)     Temp Source 09/17/19 1921 Oral     SpO2 09/17/19 1921 99 %     Weight --      Height --      Head Circumference --      Peak Flow --      Pain Score 09/17/19 1932 7     Pain Loc --      Pain Edu? --      Excl. in  [place]? --    No data found.  Updated Vital Signs BP (!) 153/89 (BP Location: Left Arm)   Pulse 80   Temp 98.3 F (36.8 C) (Oral)   Resp 16   LMP 08/27/2019   SpO2 99%   Visual Acuity Right Eye Distance:   Left Eye Distance:   Bilateral Distance:    Right Eye Near:   Left Eye Near:    Bilateral Near:     Physical Exam Constitutional:      General: She is not in acute distress. HENT:     Head: Normocephalic and atraumatic.  Eyes:     General: No scleral icterus.    Pupils: Pupils are equal, round, and reactive to light.  Cardiovascular:     Rate and Rhythm: Normal rate.  Pulmonary:     Effort: Pulmonary effort is normal.  Skin:    Coloration: Skin is not jaundiced or pale.     Comments: Right lateral upper thigh with well-healing scab.  Mild purulence expressed.  Patient significantly tender with 3 cm area of induration and erythema/warmth  Neurological:     Mental Status: She is alert and oriented to person, place, and time.      UC Treatments / Results  Labs (all labs ordered are listed, but only abnormal results are displayed) Labs Reviewed - No data to display  EKG   Radiology No results found.  Procedures Procedures (including critical care time)  Medications Ordered in UC Medications - No data  to display  Initial Impression / Assessment and Plan / UC Course  I have reviewed the triage vital signs and the nursing notes.  Pertinent labs & imaging results that were available during my care of the patient were reviewed by me and considered in my medical decision making (see chart for details).     Patient afebrile, without systemic symptoms.  Concerning for cellulitis second to bug bite-unable to perform I&D.  Will start doxycycline, supportive care as outlined below.  Return precautions discussed, pt verbalized understanding and is agreeable to plan. Final Clinical Impressions(s) / UC Diagnoses   Final diagnoses:  Cellulitis of leg, right     Discharge Instructions     Keep area(s) clean and dry. Apply hot compress / towel for 5-10 minutes 3-5 times daily. Take antibiotic as prescribed with food - important to complete course. Return for worsening pain, redness, swelling, discharge, fever.  Helpful prevention tips: Keep nails short to avoid secondary skin infections. Use new, clean razors when shaving. Avoid antiperspirants - look for deodorants without aluminum. Avoid wearing underwire bras as this can irritate the area further.     ED Prescriptions    Medication Sig Dispense Auth. Provider   doxycycline (VIBRAMYCIN) 100 MG capsule Take 1 capsule (100 mg total) by mouth 2 (two) times daily for 7 days. 14 capsule Hall-Potvin, [place], PA-C   fluconazole (DIFLUCAN) 200 MG tablet Take 1 tablet (200 mg total) by mouth once for 1 dose. May repeat in 72 hours if needed 2 tablet Hall-Potvin, [place], PA-C     PDMP not reviewed this encounter.   Hall-Potvin, [place], [place] 09/18/19 [phone]

## 2019-09-18 ENCOUNTER — Encounter: Payer: Self-pay | Admitting: Emergency Medicine

## 2021-03-30 ENCOUNTER — Ambulatory Visit: Payer: Medicaid Other | Admitting: Obstetrics

## 2021-04-18 ENCOUNTER — Other Ambulatory Visit: Payer: Self-pay

## 2021-04-18 ENCOUNTER — Ambulatory Visit
Admission: EM | Admit: 2021-04-18 | Discharge: 2021-04-18 | Disposition: A | Discharge status: Home / Self Care | Payer: Medicaid Other | Attending: Internal Medicine | Admitting: Internal Medicine

## 2021-04-18 DIAGNOSIS — N3001 Acute cystitis with hematuria: Secondary | ICD-10-CM

## 2021-04-18 DIAGNOSIS — R300 Dysuria: Secondary | ICD-10-CM | POA: Diagnosis not present

## 2021-04-18 LAB — POCT URINALYSIS DIP (MANUAL ENTRY)
Bilirubin, UA: NEGATIVE
Glucose, UA: 100 mg/dL — AB
Nitrite, UA: POSITIVE — AB
Protein Ur, POC: >=300 mg/dL — AB
Spec Grav, UA: 1.015 (ref 1.010–1.025)
Urobilinogen, UA: 2 E.U./dL — AB
pH, UA: 7 (ref 5.0–8.0)

## 2021-04-18 MED ORDER — NITROFURANTOIN MONOHYD MACRO 100 MG PO CAPS: 100.0000 mg | ORAL_CAPSULE | Freq: Two times a day (BID) | ORAL | 0 refills | Status: DC

## 2021-04-18 NOTE — ED Provider Notes (Signed)
?[place] ? ? ? ?CSN: [id number] ?Arrival date & time: 04/18/21  1905 ? ? ?  ? ?History   ?Chief Complaint ?Chief Complaint  ?Patient presents with  ? Dysuria  ? ? ?HPI ?Sonya Butler is a 46 y.o. female.  ? ?Patient presents with urinary burning and episodes of urinary incontinence that started today.  Denies urinary frequency, urinary urgency, hematuria, vaginal discharge, pelvic pain, abdominal pain, fever, back pain.   ? ? ?Dysuria ? ?Past Medical History:  ?Diagnosis Date  ? Hailey-hailey disease   ? Medical history non-contributory   ? ? ?There are no problems to display for this patient. ? ? ?Past Surgical History:  ?Procedure Laterality Date  ? CHOLECYSTECTOMY    ? DILATION AND CURETTAGE OF UTERUS    ? ? ?OB History   ? ? Gravida  ?4  ? Para  ?2  ? Term  ?2  ? Preterm  ?   ? AB  ?1  ? Living  ?2  ?  ? ? SAB  ?   ? IAB  ?1  ? Ectopic  ?   ? Multiple  ?   ? Live Births  ?2  ?   ?  ?  ? ? ? ?Home Medications   ? ?Prior to Admission medications   ?Medication Sig Start Date End Date Taking? Authorizing Provider  ?nitrofurantoin, macrocrystal-monohydrate, (MACROBID) 100 MG capsule Take 1 capsule (100 mg total) by mouth 2 (two) times daily. 04/18/21  Yes Teodora Medici, FNP  ?glycopyrrolate (ROBINUL) 1 MG tablet Take 1 mg by mouth 3 (three) times daily.    [provider]  ?LO LOESTRIN FE 1 MG-10 MCG / 10 MCG tablet TAKE 1 TABLET BY MOUTH EVERY DAY 05/03/19   Shelly Bombard, MD  ?naltrexone (DEPADE) 50 MG tablet Take 3 mg by mouth daily.    [provider]  ? ? ?Family History ?Family History  ?Problem Relation Age of Onset  ? Diabetes Mother   ? Hypertension Mother   ? Thyroid disease Mother   ? Colon cancer Father   ? Cancer Father   ? Breast cancer Paternal Grandmother   ? ? ?Social History ?Social History  ? ?Tobacco Use  ? Smoking status: Never  ? Smokeless tobacco: Never  ?Vaping Use  ? Vaping Use: Never used  ?Substance Use Topics  ? Alcohol use: Yes  ?  Alcohol/week: 0.0  standard drinks  ?  Comment: social  ? Drug use: No  ? ? ? ?Allergies   ?Patient has no known allergies. ? ? ?Review of Systems ?Review of Systems ?Per HPI ? ?Physical Exam ?Triage Vital Signs ?ED Triage Vitals [04/18/21 1933]  ?Enc Vitals Group  ?   BP (!) 134/91  ?   Pulse Rate 89  ?   Resp 18  ?   Temp 98.3 ?F (36.8 ?C)  ?   Temp Source Oral  ?   SpO2 98 %  ?   Weight   ?   Height   ?   Head Circumference   ?   Peak Flow   ?   Pain Score 0  ?   Pain Loc   ?   Pain Edu?   ?   Excl. in [place]?   ? ?No data found. ? ?Updated Vital Signs ?BP (!) 134/91 (BP Location: Left Arm)   Pulse 89   Temp 98.3 ?F (36.8 ?C) (Oral)   Resp 18   SpO2  98%  ? ?Visual Acuity ?Right Eye Distance:   ?Left Eye Distance:   ?Bilateral Distance:   ? ?Right Eye Near:   ?Left Eye Near:    ?Bilateral Near:    ? ?Physical Exam ?Constitutional:   ?   General: She is not in acute distress. ?   Appearance: Normal appearance. She is not toxic-appearing or diaphoretic.  ?HENT:  ?   Head: Normocephalic and atraumatic.  ?Eyes:  ?   Extraocular Movements: Extraocular movements intact.  ?   Conjunctiva/sclera: Conjunctivae normal.  ?Cardiovascular:  ?   Rate and Rhythm: Normal rate and regular rhythm.  ?   Pulses: Normal pulses.  ?   Heart sounds: Normal heart sounds.  ?Pulmonary:  ?   Effort: Pulmonary effort is normal. No respiratory distress.  ?   Breath sounds: Normal breath sounds.  ?Abdominal:  ?   General: Abdomen is flat. Bowel sounds are normal. There is no distension.  ?   Tenderness: There is no abdominal tenderness.  ?Neurological:  ?   General: No focal deficit present.  ?   Mental Status: She is alert and oriented to person, place, and time. Mental status is at baseline.  ?Psychiatric:     ?   Mood and Affect: Mood normal.     ?   Behavior: Behavior normal.     ?   Thought Content: Thought content normal.     ?   Judgment: Judgment normal.  ? ? ? ?UC Treatments / Results  ?Labs ?(all labs ordered are listed, but only abnormal results are  displayed) ?Labs Reviewed  ?POCT URINALYSIS DIP (MANUAL ENTRY) - Abnormal; Notable for the following components:  ?    Result Value  ? Color, UA orange (*)   ? Clarity, UA cloudy (*)   ? Glucose, UA =100 (*)   ? Ketones, POC UA trace (5) (*)   ? Blood, UA small (*)   ? Protein Ur, POC >=300 (*)   ? Urobilinogen, UA 2.0 (*)   ? Nitrite, UA Positive (*)   ? Leukocytes, UA Large (3+) (*)   ? All other components within normal limits  ?URINE CULTURE  ? ? ?EKG ? ? ?Radiology ?No results found. ? ?Procedures ?Procedures (including critical care time) ? ?Medications Ordered in UC ?Medications - No data to display ? ?Initial Impression / Assessment and Plan / UC Course  ?I have reviewed the triage vital signs and the nursing notes. ? ?Pertinent labs & imaging results that were available during my care of the patient were reviewed by me and considered in my medical decision making (see chart for details). ? ?  ? ?Urinalysis indicating urinary tract infection.  Will treat with Macrobid antibiotic.  Urine culture pending.  Patient to increase clear oral fluid intake to prevent dehydration.  Discussed return precautions.  Patient verbalized understanding and was agreeable with plan. ?Final Clinical Impressions(s) / UC Diagnoses  ? ?Final diagnoses:  ?Acute cystitis with hematuria  ?Dysuria  ? ? ? ?Discharge Instructions   ? ?  ?It appears that you have a urinary tract infection which is being treated with antibiotic.  Urine culture is pending. ? ? ? ?ED Prescriptions   ? ? Medication Sig Dispense Auth. Provider  ? nitrofurantoin, macrocrystal-monohydrate, (MACROBID) 100 MG capsule Take 1 capsule (100 mg total) by mouth 2 (two) times daily. 10 capsule Oswaldo Conroy E, [place]  ? ?  ? ?PDMP not reviewed this encounter. ?  ?Teodora Medici, FNP ?  04/18/21 2003 ? ?

## 2021-04-18 NOTE — ED Triage Notes (Signed)
Pt c/o dysuria and incontinence onset today concerned for uti.  ?

## 2021-04-18 NOTE — Discharge Instructions (Signed)
It appears that you have a urinary tract infection which is being treated with antibiotic.  Urine culture is pending. ?

## 2021-04-21 LAB — URINE CULTURE: Culture: >=100000 — AB

## 2021-04-26 ENCOUNTER — Ambulatory Visit (INDEPENDENT_AMBULATORY_CARE_PROVIDER_SITE_OTHER): Payer: Medicaid Other | Admitting: Obstetrics

## 2021-04-26 ENCOUNTER — Other Ambulatory Visit: Payer: Self-pay

## 2021-04-26 ENCOUNTER — Encounter: Payer: Self-pay | Admitting: Obstetrics

## 2021-04-26 ENCOUNTER — Other Ambulatory Visit (HOSPITAL_COMMUNITY)
Admission: RE | Admit: 2021-04-26 | Discharge: 2021-04-26 | Disposition: A | Discharge status: Home / Self Care | Payer: Medicaid Other | Source: Ambulatory Visit | Attending: Obstetrics | Admitting: Obstetrics

## 2021-04-26 VITALS — BP 131/88 | HR 95 | Wt 165.0 lb

## 2021-04-26 DIAGNOSIS — N898 Other specified noninflammatory disorders of vagina: Secondary | ICD-10-CM | POA: Insufficient documentation

## 2021-04-26 DIAGNOSIS — Z01419 Encounter for gynecological examination (general) (routine) without abnormal findings: Secondary | ICD-10-CM | POA: Insufficient documentation

## 2021-04-26 DIAGNOSIS — Z30011 Encounter for initial prescription of contraceptive pills: Secondary | ICD-10-CM

## 2021-04-26 DIAGNOSIS — N946 Dysmenorrhea, unspecified: Secondary | ICD-10-CM | POA: Diagnosis not present

## 2021-04-26 DIAGNOSIS — Z1211 Encounter for screening for malignant neoplasm of colon: Secondary | ICD-10-CM

## 2021-04-26 DIAGNOSIS — Z1239 Encounter for other screening for malignant neoplasm of breast: Secondary | ICD-10-CM

## 2021-04-26 DIAGNOSIS — Z3009 Encounter for other general counseling and advice on contraception: Secondary | ICD-10-CM

## 2021-04-26 DIAGNOSIS — Z113 Encounter for screening for infections with a predominantly sexual mode of transmission: Secondary | ICD-10-CM

## 2021-04-26 LAB — POCT URINE PREGNANCY: Preg Test, Ur: NEGATIVE

## 2021-04-26 MED ORDER — IBUPROFEN 800 MG PO TABS: 800.0000 mg | ORAL_TABLET | Freq: Three times a day (TID) | ORAL | 5 refills | Status: AC | PRN

## 2021-04-26 MED ORDER — SLYND 4 MG PO TABS: 1.0000 | ORAL_TABLET | Freq: Every day | ORAL | 11 refills | Status: DC

## 2021-04-26 MED ORDER — METRONIDAZOLE 500 MG PO TABS: 500.0000 mg | ORAL_TABLET | Freq: Two times a day (BID) | ORAL | 2 refills | Status: DC

## 2021-04-26 MED ORDER — FLUCONAZOLE 150 MG PO TABS
150.0000 mg | ORAL_TABLET | Freq: Once | ORAL | 2 refills | Status: AC
Start: 2021-04-26 — End: 2021-04-26

## 2021-04-26 NOTE — Progress Notes (Signed)
Pt states she is having longer heavier cycles the last few months. Pt is having pain/cramping with cycles.  ? ?Pt recently treated for UTI. ? ?Pt is not on any BC.  ? ?PHQ and GAD negative today. ?

## 2021-04-26 NOTE — Progress Notes (Signed)
? ?Subjective: ? ? ?  ?  ? Sonya Butler is a 46 y.o. female here for a routine exam.  Current complaints: Heavy and painful periods.  Also c/o vaginal discharge.   ? ?Personal health questionnaire:  ?Is patient [name], have a family history of breast and/or ovarian cancer: no ?Is there a family history of uterine cancer diagnosed at age < 42, gastrointestinal cancer, urinary tract cancer, family member who is a Field seismologist syndrome-associated carrier: yes ?Is the patient overweight and hypertensive, family history of diabetes, personal history of gestational diabetes, preeclampsia or PCOS: no ?Is patient over 93, have PCOS,  family history of premature CHD under age 38, diabetes, smoke, have hypertension or peripheral artery disease:  no ?At any time, has a partner hit, kicked or otherwise hurt or frightened you?: no ?Over the past 2 weeks, have you felt down, depressed or hopeless?: no ?Over the past 2 weeks, have you felt little interest or pleasure in doing things?:no ? ? ?Gynecologic History ?Patient's last menstrual period was 03/29/2021. ?Contraception: none ?Last Pap: 2020. Results were: normal ?Last mammogram: 2020. Results were: normal ? ?Obstetric History ?OB History  ?Gravida Para Term Preterm AB Living  ?4 2 2   1 2   ?SAB IAB Ectopic Multiple Live Births  ?  1     2  ?  ?# Outcome Date GA Lbr Len/2nd Weight Sex Delivery Anes PTL Lv  ?4 Gravida           ?3 IAB 2015        DEC  ?2 Term 12/27/04 [redacted]w[redacted]d  7 lb 4 oz (3.289 kg) M Vag-Spont EPI  LIV  ?1 Term 05/03/92   7 lb 9 oz (3.43 kg) M Vag-Spont EPI  LIV  ? ? ?Past Medical History:  ?Diagnosis Date  ? Hailey-hailey disease   ? Medical history non-contributory   ?  ?Past Surgical History:  ?Procedure Laterality Date  ? CHOLECYSTECTOMY    ? DILATION AND CURETTAGE OF UTERUS    ?  ? ?Current Outpatient Medications:  ?  glycopyrrolate (ROBINUL) 1 MG tablet, Take 1 mg by mouth 3 (three) times daily., Disp: , Rfl:  ?  naltrexone (DEPADE) 50 MG tablet, Take  3 mg by mouth daily., Disp: , Rfl:  ?  LO LOESTRIN FE 1 MG-10 MCG / 10 MCG tablet, TAKE 1 TABLET BY MOUTH EVERY DAY, Disp: 28 tablet, Rfl: 4 ?  nitrofurantoin, macrocrystal-monohydrate, (MACROBID) 100 MG capsule, Take 1 capsule (100 mg total) by mouth 2 (two) times daily., Disp: 10 capsule, Rfl: 0 ?No Known Allergies  ?Social History  ? ?Tobacco Use  ? Smoking status: Never  ? Smokeless tobacco: Never  ?Substance Use Topics  ? Alcohol use: Yes  ?  Alcohol/week: 0.0 standard drinks  ?  Comment: social  ?  ?Family History  ?Problem Relation Age of Onset  ? Diabetes Mother   ? Hypertension Mother   ? Thyroid disease Mother   ? Colon cancer Father   ? Cancer Father   ? Breast cancer Paternal Grandmother   ?  ? ? ?Review of Systems ? ?Constitutional: negative for fatigue and weight loss ?Respiratory: negative for cough and wheezing ?Cardiovascular: negative for chest pain, fatigue and palpitations ?Gastrointestinal: negative for abdominal pain and change in bowel habits ?Musculoskeletal:negative for myalgias ?Neurological: negative for gait problems and tremors ?Behavioral/Psych: negative for abusive relationship, depression ?Endocrine: negative for temperature intolerance    ?Genitourinary: positive for vaginal discharge.  negative for abnormal  menstrual periods, genital lesions, hot flashes, sexual problems  ?Integument/breast: negative for breast lump, breast tenderness, nipple discharge and skin lesion(s) ? ?  ?Objective:  ? ?    ?BP 131/88   Pulse 95   Wt 165 lb (74.8 kg)   LMP 03/29/2021   BMI 29.23 kg/m?  ?General:   Alert and no distress  ?Skin:   no rash or abnormalities  ?Lungs:   clear to auscultation bilaterally  ?Heart:   regular rate and rhythm, S1, S2 normal, no murmur, click, rub or gallop  ?Breasts:   normal without suspicious masses, skin or nipple changes or axillary nodes  ?Abdomen:  normal findings: no organomegaly, soft, non-tender and no hernia  ?Pelvis:  External genitalia: normal general  appearance ?Urinary system: urethral meatus normal and bladder without fullness, nontender ?Vaginal: normal without tenderness, induration or masses ?Cervix: normal appearance ?Adnexa: normal bimanual exam ?Uterus: anteverted and non-tender, normal size  ? ?Lab Review ?Urine pregnancy test ?Labs reviewed yes ?Radiologic studies reviewed yes ? ?I have spent a total of 20 minutes of face-to-face time, excluding clinical staff time, reviewing notes and preparing to see patient, ordering tests and/or medications, and counseling the patient.  ? ?Assessment:  ? ?1. Encounter for routine gynecological examination with Papanicolaou smear of cervix ?Rx: ?- Cytology - PAP( [place]) ? ?2. Dysmenorrhea ?Rx: ?- ibuprofen (ADVIL) 800 MG tablet; Take 1 tablet (800 mg total) by mouth every 8 (eight) hours as needed.  Dispense: 30 tablet; Refill: 5 ? ?3. Vaginal discharge ?Rx: ?- Cervicovaginal ancillary only( [place]) ?- metroNIDAZOLE (FLAGYL) 500 MG tablet; Take 1 tablet (500 mg total) by mouth 2 (two) times daily.  Dispense: 14 tablet; Refill: 2 ?- fluconazole (DIFLUCAN) 150 MG tablet; Take 1 tablet (150 mg total) by mouth once for 1 dose.  Dispense: 1 tablet; Refill: 2 ? ?4. Encounter for other general counseling and advice on contraception ?- options discussed ?- wants options ? ?5. Encounter for initial prescription of contraceptive pills ?Rx: ?- POCT urine pregnancy ?- Drospirenone (SLYND) 4 MG TABS; Take 1 tablet by mouth daily before breakfast.  Dispense: 28 tablet; Refill: 11 ? ?6. Screening for colon cancer ?Rx: ?- Ambulatory referral to Gastroenterology ? ?7. Screening breast examination ?Rx: ?- MM Digital Screening; Future ? ?8. Screening for STD (sexually transmitted disease) ?Rx: ?- HIV Antibody (routine testing w rflx) ?- Hepatitis B surface antigen ?- Hepatitis C antibody ?- RPR  ?  ? ?Plan:  ? ? Education reviewed: calcium supplements, depression evaluation, low fat, low cholesterol diet, safe sex/STD  prevention, self breast exams, and weight bearing exercise. ?Contraception: OCP (estrogen/progesterone). ?Mammogram ordered. ?Follow up in: 1 year.  ? ? ?Orders Placed This Encounter  ?Procedures  ? Ambulatory referral to Gastroenterology  ?  Referral Priority:   Routine  ?  Referral Type:   Consultation  ?  Referral Reason:   Specialty Services Required  ?  Number of Visits Requested:   1  ? ? ? Shelly Bombard, MD ?04/26/2021 2:33 PM  ?

## 2021-04-27 LAB — CYTOLOGY - PAP
Comment: NEGATIVE
Diagnosis: NEGATIVE
High risk HPV: NEGATIVE

## 2021-04-27 LAB — CERVICOVAGINAL ANCILLARY ONLY
Bacterial Vaginitis (gardnerella): POSITIVE — AB
Candida Glabrata: NEGATIVE
Candida Vaginitis: NEGATIVE
Chlamydia: NEGATIVE
Comment: NEGATIVE
Comment: NEGATIVE
Comment: NEGATIVE
Comment: NEGATIVE
Comment: NEGATIVE
Comment: NORMAL
Neisseria Gonorrhea: NEGATIVE
Trichomonas: POSITIVE — AB

## 2021-04-27 LAB — HEPATITIS B SURFACE ANTIGEN: Hepatitis B Surface Ag: NEGATIVE

## 2021-04-27 LAB — HEPATITIS C ANTIBODY: Hep C Virus Ab: NONREACTIVE

## 2021-04-27 LAB — RPR: RPR Ser Ql: NONREACTIVE

## 2021-04-27 LAB — HIV ANTIBODY (ROUTINE TESTING W REFLEX): HIV Screen 4th Generation wRfx: NONREACTIVE

## 2021-05-01 ENCOUNTER — Other Ambulatory Visit: Payer: Self-pay | Admitting: Obstetrics

## 2021-05-07 ENCOUNTER — Ambulatory Visit
Admission: RE | Admit: 2021-05-07 | Discharge: 2021-05-07 | Disposition: A | Discharge status: Home / Self Care | Payer: Medicaid Other | Source: Ambulatory Visit | Attending: Obstetrics | Admitting: Obstetrics

## 2021-05-07 DIAGNOSIS — Z1239 Encounter for other screening for malignant neoplasm of breast: Secondary | ICD-10-CM

## 2021-05-14 ENCOUNTER — Other Ambulatory Visit: Payer: Self-pay

## 2021-05-14 ENCOUNTER — Ambulatory Visit (AMBULATORY_SURGERY_CENTER): Payer: Medicaid Other | Admitting: *Deleted

## 2021-05-14 VITALS — Ht 62.0 in | Wt 165.0 lb

## 2021-05-14 DIAGNOSIS — Z1211 Encounter for screening for malignant neoplasm of colon: Secondary | ICD-10-CM

## 2021-05-14 MED ORDER — PLENVU 140 G PO SOLR: 1.0000 | Freq: Once | ORAL | 0 refills | Status: AC

## 2021-05-14 NOTE — Progress Notes (Signed)
No egg or soy allergy known to patient  ?No issues known to pt with past sedation with any surgeries or procedures ?Patient denies ever being told they had issues or difficulty with intubation  ?No FH of Malignant Hyperthermia ?Pt is not on diet pills ?Pt is not on  home 02  ?Pt is not on blood thinners  ?Pt denies issues with constipation  ?No A fib or A flutter ? ?Pt is not vaccinated  for Covid  ? ?plenvu Coupon to pt in PV today , Code to Pharmacy and  NO PA's for preps.  ? ?Due to the COVID-19 pandemic we are asking patients to follow certain guidelines in PV and the [facility]   ?Pt aware of COVID protocols and [facility] guidelines  ? ?PV completed over the phone. Pt verified name, DOB, address and insurance during PV today.  ?Pt mailed instruction packet with copy of consent form to read and not return, and instructions.  ?Pt encouraged to call with questions or issues.  ?If pt has My chart, procedure instructions sent via My Chart   ?

## 2021-05-30 ENCOUNTER — Encounter: Payer: Self-pay | Admitting: Gastroenterology

## 2021-05-30 ENCOUNTER — Ambulatory Visit (AMBULATORY_SURGERY_CENTER): Payer: Medicaid Other | Admitting: Gastroenterology

## 2021-05-30 VITALS — BP 128/77 | HR 86 | Temp 98.0°F | Resp 19 | Ht 62.0 in | Wt 165.0 lb

## 2021-05-30 DIAGNOSIS — Z800 Family history of malignant neoplasm of digestive organs: Secondary | ICD-10-CM | POA: Diagnosis not present

## 2021-05-30 DIAGNOSIS — Z1211 Encounter for screening for malignant neoplasm of colon: Secondary | ICD-10-CM | POA: Diagnosis not present

## 2021-05-30 MED ORDER — SODIUM CHLORIDE 0.9 % IV SOLN: 500.0000 mL | Freq: Once | INTRAVENOUS | Status: DC

## 2021-05-30 NOTE — Progress Notes (Signed)
History and Physical: ? This patient presents for endoscopic testing for: ?Encounter Diagnosis  ?Name Primary?  ? Family history of colon cancer in father Yes  ? ? ?First colonoscopy ?Patient denies chronic abdominal pain, rectal bleeding, constipation or diarrhea. ? ? ?ROS: ?Patient denies chest pain or shortness of breath ? ? ?Past Medical History: ?Past Medical History:  ?Diagnosis Date  ? Allergy   ? seasonal  ? Hailey-hailey disease   ? skin disease  ? Medical history non-contributory   ? ? ? ?Past Surgical History: ?Past Surgical History:  ?Procedure Laterality Date  ? CHOLECYSTECTOMY    ? DILATION AND CURETTAGE OF UTERUS    ? ? ?Allergies: ?Allergies  ?Allergen Reactions  ? Latex Hives  ? ? ?Outpatient Meds: ?Current Outpatient Medications  ?Medication Sig Dispense Refill  ? Drospirenone (SLYND) 4 MG TABS Take 1 tablet by mouth daily before breakfast. 28 tablet 11  ? DUPIXENT 300 MG/2ML SOPN Inject into the skin.    ? glycopyrrolate (ROBINUL) 1 MG tablet Take 1 mg by mouth 3 (three) times daily.    ? ibuprofen (ADVIL) 800 MG tablet Take 1 tablet (800 mg total) by mouth every 8 (eight) hours as needed. 30 tablet 5  ? naltrexone (DEPADE) 50 MG tablet Take 3 mg by mouth daily.    ? silver sulfADIAZINE (SILVADENE) 1 % cream Apply to underarms 1-2 times a day as needed.    ? triamcinolone ointment (KENALOG) 0.1 % APPLY TWICE DAILY TO AFFECTED AREAS ON BODY UNTIL IMPROVED THEN AS NEEDED FOR FLARES    ? metroNIDAZOLE (FLAGYL) 500 MG tablet Take 1 tablet (500 mg total) by mouth 2 (two) times daily. 14 tablet 2  ? ?Current Facility-Administered Medications  ?Medication Dose Route Frequency Provider Last Rate Last Admin  ? 0.9 %  sodium chloride infusion  500 mL Intravenous Once Doran Stabler, MD      ? ? ? ? ?___________________________________________________________________ ?Objective  ? ?Exam: ? ?BP (!) 132/107   Pulse 92   Temp 98 ?F (36.7 ?C)   Ht 5\' 2"  (1.575 m)   Wt 165 lb (74.8 kg)   LMP 05/30/2021  Comment: began period today  SpO2 100%   BMI 30.18 kg/m?  ? ?CV: RRR without murmur, S1/S2 ?Resp: clear to auscultation bilaterally, normal RR and effort noted ?GI: soft, no tenderness, with active bowel sounds. ? ? ?Assessment: ?Encounter Diagnosis  ?Name Primary?  ? Family history of colon cancer in father Yes  ? ? ? ?Plan: ?Colonoscopy ? The benefits and risks of the planned procedure were described in detail with the patient or (when appropriate) their health care proxy.  Risks were outlined as including, but not limited to, bleeding, infection, perforation, adverse medication reaction leading to cardiac or pulmonary decompensation, pancreatitis (if ERCP).  The limitation of incomplete mucosal visualization was also discussed.  No guarantees or warranties were given. ? ? ? ?The patient is appropriate for an endoscopic procedure in the ambulatory setting. ? ? - Wilfrid Lund, MD ? ? ? ? ?

## 2021-05-30 NOTE — Patient Instructions (Signed)
YOU HAD AN ENDOSCOPIC PROCEDURE TODAY AT THE [facility]:   Refer to the procedure report that was given to you for any specific questions about what was found during the examination.  If the procedure report does not answer your questions, please call your gastroenterologist to clarify.  If you requested that your care partner not be given the details of your procedure findings, then the procedure report has been included in a sealed envelope for you to review at your convenience later.  YOU SHOULD EXPECT: Some feelings of bloating in the abdomen. Passage of more gas than usual.  Walking can help get rid of the air that was put into your GI tract during the procedure and reduce the bloating. If you had a lower endoscopy (such as a colonoscopy or flexible sigmoidoscopy) you may notice spotting of blood in your stool or on the toilet paper. If you underwent a bowel prep for your procedure, you may not have a normal bowel movement for a few days.  Please Note:  You might notice some irritation and congestion in your nose or some drainage.  This is from the oxygen used during your procedure.  There is no need for concern and it should clear up in a day or so.  SYMPTOMS TO REPORT IMMEDIATELY:   Following lower endoscopy (colonoscopy or flexible sigmoidoscopy):  Excessive amounts of blood in the stool  Significant tenderness or worsening of abdominal pains  Swelling of the abdomen that is new, acute  Fever of 100F or higher  For urgent or emergent issues, a gastroenterologist can be reached at any hour by calling [phone]. Do not use MyChart messaging for urgent concerns.    DIET:  We do recommend a small meal at first, but then you may proceed to your regular diet.  Drink plenty of fluids but you should avoid alcoholic beverages for 24 hours.  ACTIVITY:  You should plan to take it easy for the rest of today and you should NOT DRIVE or use heavy machinery until tomorrow (because  of the sedation medicines used during the test).    FOLLOW UP: Our staff will call the number listed on your records 48-72 hours following your procedure to check on you and address any questions or concerns that you may have regarding the information given to you following your procedure. If we do not reach you, we will leave a message.  We will attempt to reach you two times.  During this call, we will ask if you have developed any symptoms of COVID 19. If you develop any symptoms (ie: fever, flu-like symptoms, shortness of breath, cough etc.) before then, please call [phone].  If you test positive for Covid 19 in the 2 weeks post procedure, please call and report this information to us.    If any biopsies were taken you will be contacted by phone or by letter within the next 1-3 weeks.  Please call us at [phone] if you have not heard about the biopsies in 3 weeks.    SIGNATURES/CONFIDENTIALITY: You and/or your care partner have signed paperwork which will be entered into your electronic medical record.  These signatures attest to the fact that that the information above on your After Visit Summary has been reviewed and is understood.  Full responsibility of the confidentiality of this discharge information lies with you and/or your care-partner. 

## 2021-05-30 NOTE — Progress Notes (Signed)
Sedate, gd SR, tolerated procedure well, VSS, report to RN 

## 2021-05-30 NOTE — Op Note (Signed)
[facility] ?Patient Name: Sonya SnowLatara Butler ?Procedure Date: 05/30/2021 8:45 AM ?MRN: [id number] ?Endoscopist: Starr LakeHenry L. Myrtie Neitheranis , MD ?Age: 46 ?Referring MD:  ?Date of Birth: Dec 01, 1975 ?Gender: Female ?Account #: [id number] ?Procedure:                Colonoscopy ?Indications:              Screening in patient at increased risk: Colorectal  ?                          cancer in father before age 46, This is the  ?                          patient's first colonoscopy ?Medicines:                Monitored Anesthesia Care ?Procedure:                Pre-Anesthesia Assessment: ?                          - Prior to the procedure, a History and Physical  ?                          was performed, and patient medications and  ?                          allergies were reviewed. The patient's tolerance of  ?                          previous anesthesia was also reviewed. The risks  ?                          and benefits of the procedure and the sedation  ?                          options and risks were discussed with the patient.  ?                          All questions were answered, and informed consent  ?                          was obtained. Prior Anticoagulants: The patient has  ?                          taken no previous anticoagulant or antiplatelet  ?                          agents. ASA Grade Assessment: II - A patient with  ?                          mild systemic disease. After reviewing the risks  ?                          and benefits, the patient was deemed in  ?  satisfactory condition to undergo the procedure. ?                          After obtaining informed consent, the colonoscope  ?                          was passed under direct vision. Throughout the  ?                          procedure, the patient's blood pressure, pulse, and  ?                          oxygen saturations were monitored continuously. The  ?                          Olympus CF-HQ190L (#[id number])  Colonoscope was  ?                          introduced through the anus and advanced to the the  ?                          cecum, identified by appendiceal orifice and  ?                          ileocecal valve. The colonoscopy was performed  ?                          without difficulty. The patient tolerated the  ?                          procedure well. The quality of the bowel  ?                          preparation was excellent. The ileocecal valve,  ?                          appendiceal orifice, and rectum were photographed.  ?                          The bowel preparation used was Plenvu. ?Scope In: 8:52:15 AM ?Scope Out: 9:05:49 AM ?Scope Withdrawal Time: 0 hours 10 minutes 14 seconds  ?Total Procedure Duration: 0 hours 13 minutes 34 seconds  ?Findings:                 The perianal and digital rectal examinations were  ?                          normal. ?                          The entire examined colon appeared normal on direct  ?                          and retroflexion views. ?Complications:            No immediate complications. ?Estimated  Blood Loss:     Estimated blood loss: none. ?Impression:               - The entire examined colon is normal on direct and  ?                          retroflexion views. ?                          - No specimens collected. ?Recommendation:           - Patient has a contact number available for  ?                          emergencies. The signs and symptoms of potential  ?                          delayed complications were discussed with the  ?                          patient. Return to normal activities tomorrow.  ?                          Written discharge instructions were provided to the  ?                          patient. ?                          - Resume previous diet. ?                          - Continue present medications. ?                          - Repeat colonoscopy in 5 years for screening  ?                          purposes. ?Katasha Riga L.  Myrtie Neither, MD ?05/30/2021 9:08:53 AM ?This report has been signed electronically. ?

## 2021-06-01 ENCOUNTER — Telehealth: Payer: Self-pay

## 2021-06-01 NOTE — Telephone Encounter (Signed)
?  Follow up Call- ? ? ?  05/30/2021  ?  8:28 AM  ?Call back number  ?Post procedure Call Back phone  # [phone]  ?Permission to leave phone message Yes  ?  ? ?Patient questions: ? ?Do you have a fever, pain , or abdominal swelling? No. ?Pain Score  0 * ? ?Have you tolerated food without any problems? Yes.   ? ?Have you been able to return to your normal activities? Yes.   ? ?Do you have any questions about your discharge instructions: ?Diet   No. ?Medications  No. ?Follow up visit  No. ? ?Do you have questions or concerns about your Care? No. ? ?Actions: ?* If pain score is 4 or above: ?No action needed, pain <4. ? ? ?

## 2021-09-07 ENCOUNTER — Other Ambulatory Visit: Payer: Self-pay | Admitting: Emergency Medicine

## 2021-09-07 ENCOUNTER — Encounter: Payer: Self-pay | Admitting: Emergency Medicine

## 2021-09-07 DIAGNOSIS — R300 Dysuria: Secondary | ICD-10-CM

## 2021-09-07 MED ORDER — NITROFURANTOIN MONOHYD MACRO 100 MG PO CAPS: 100.0000 mg | ORAL_CAPSULE | Freq: Two times a day (BID) | ORAL | 0 refills | Status: DC

## 2021-09-07 NOTE — Progress Notes (Signed)
RX sent for UTI symptoms.

## 2021-10-10 IMAGING — MG DIGITAL DIAGNOSTIC BILAT W/ TOMO W/ CAD
8 series · 8 of 24 positions shown · non-contrast
Comparison: Previous exam(s).

CLINICAL DATA: 43-year-old with a palpable lump in the OUTER LEFT
breast immediately prior to menses, but which which she no longer
feels after completing menses. Annual evaluation, RIGHT breast.

EXAM:
DIGITAL DIAGNOSTIC BILATERAL MAMMOGRAM WITH CAD AND TOMO
ULTRASOUND LEFT BREAST

[L MLO synth-2D]
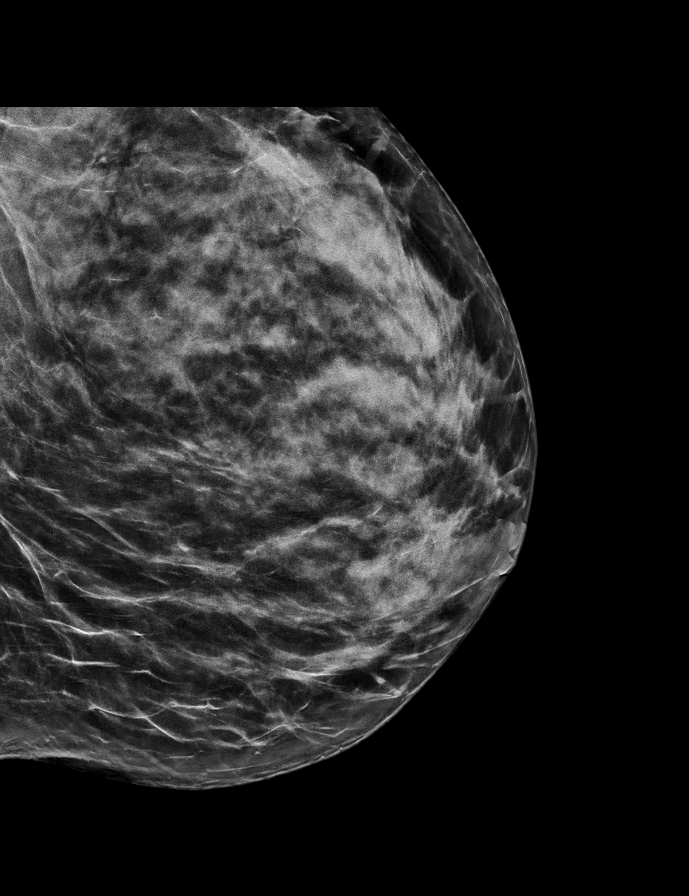

[R CC synth-2D]
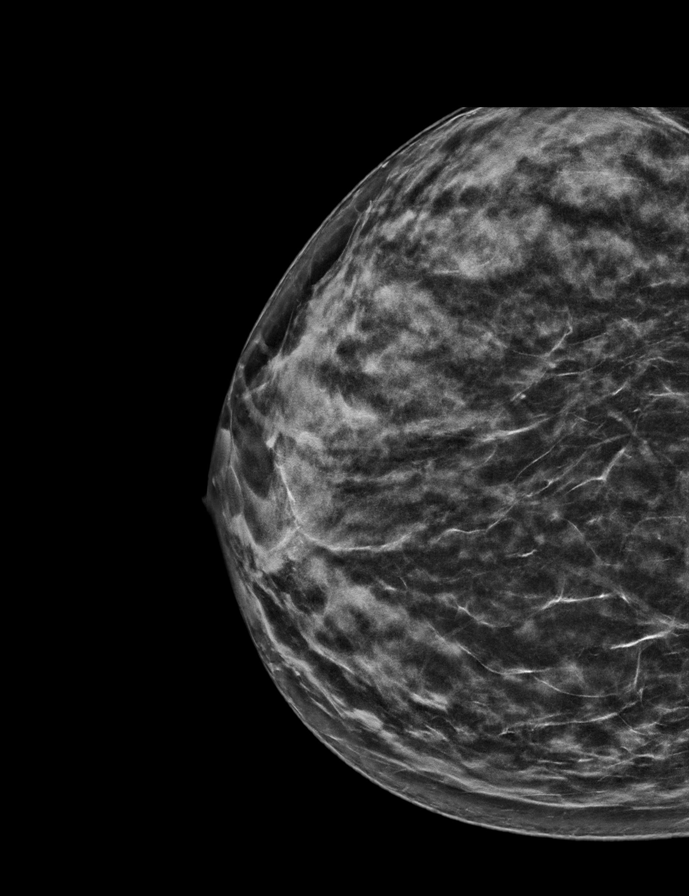

[L CC synth-2D]
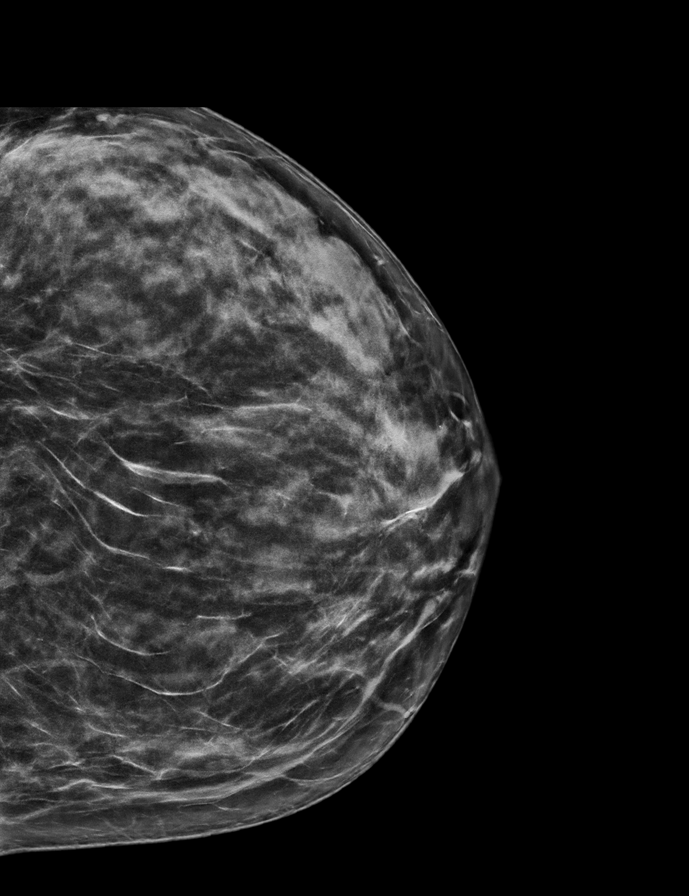

[R MLO synth-2D]
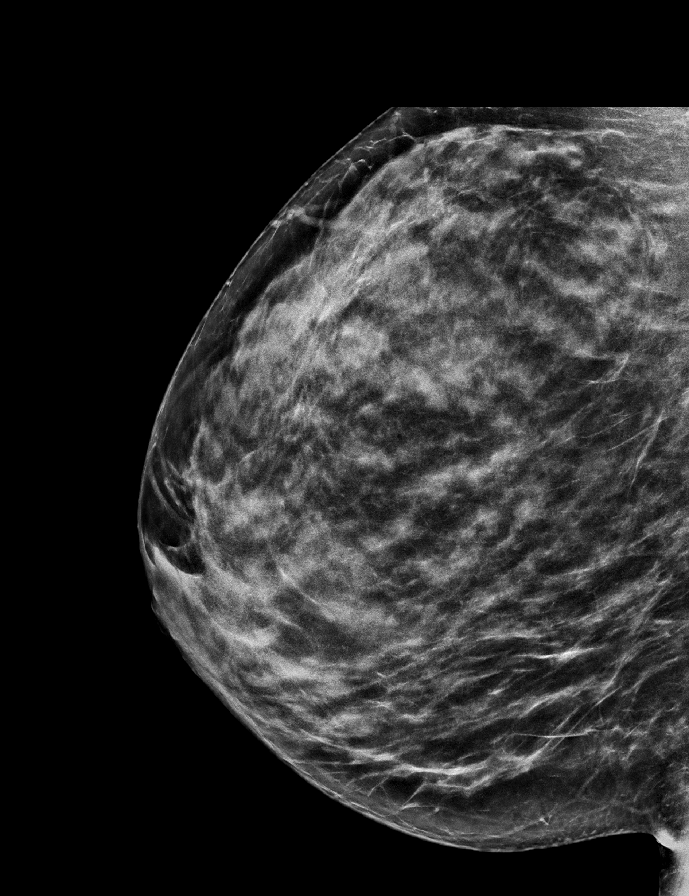

[R MLO tomo · tomo slice 31/60.0]
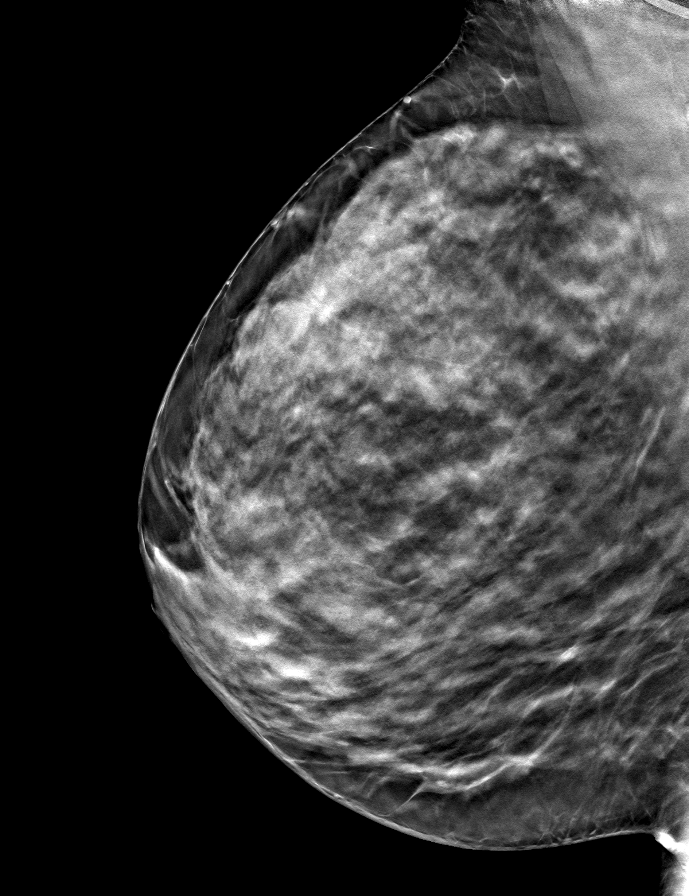

[L MLO tomo · tomo slice 34/67.0]
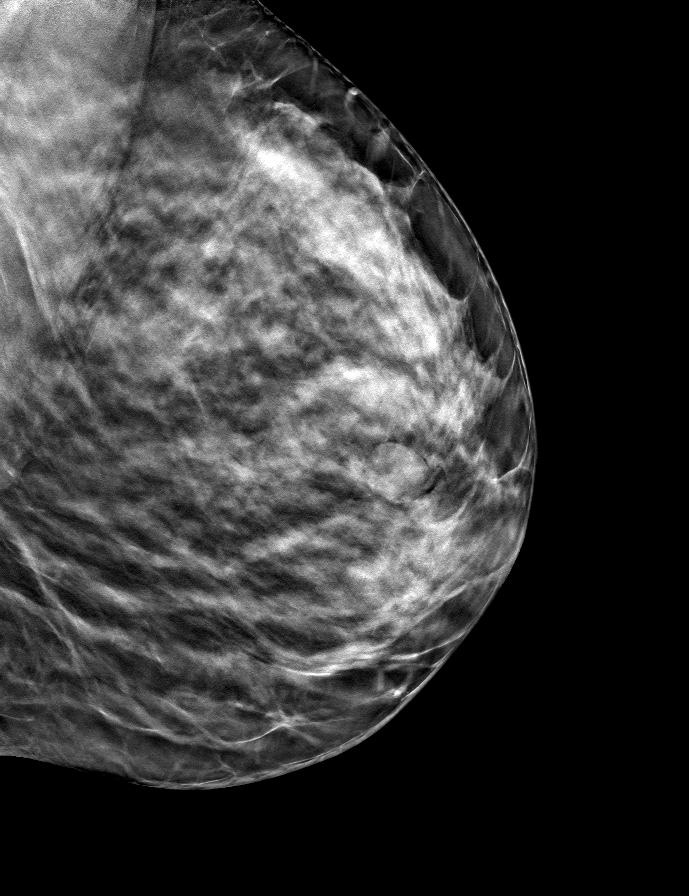

[L CC tomo · tomo slice 33/66.0]
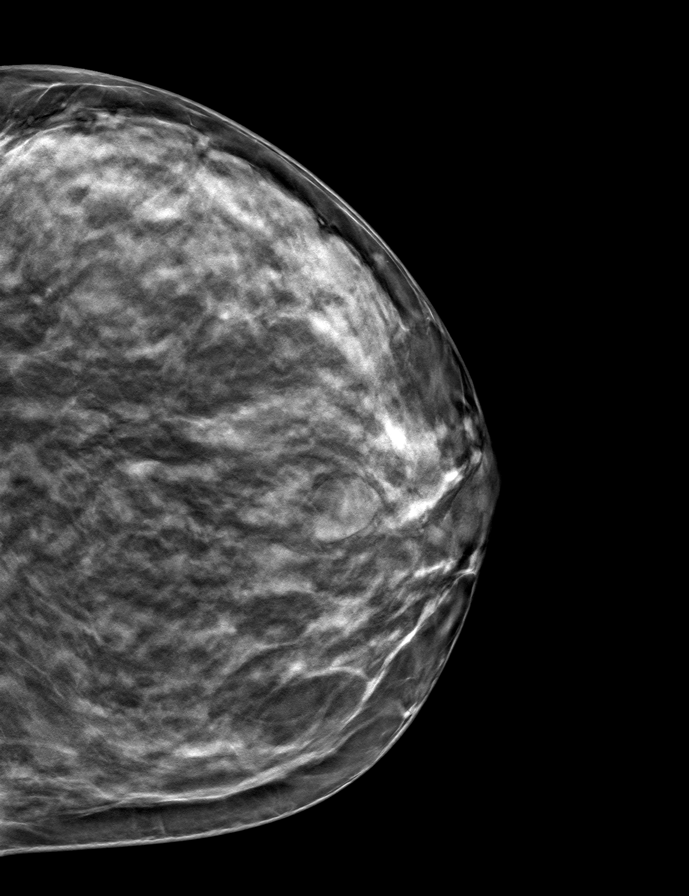

[R CC tomo · tomo slice 31/61.0]
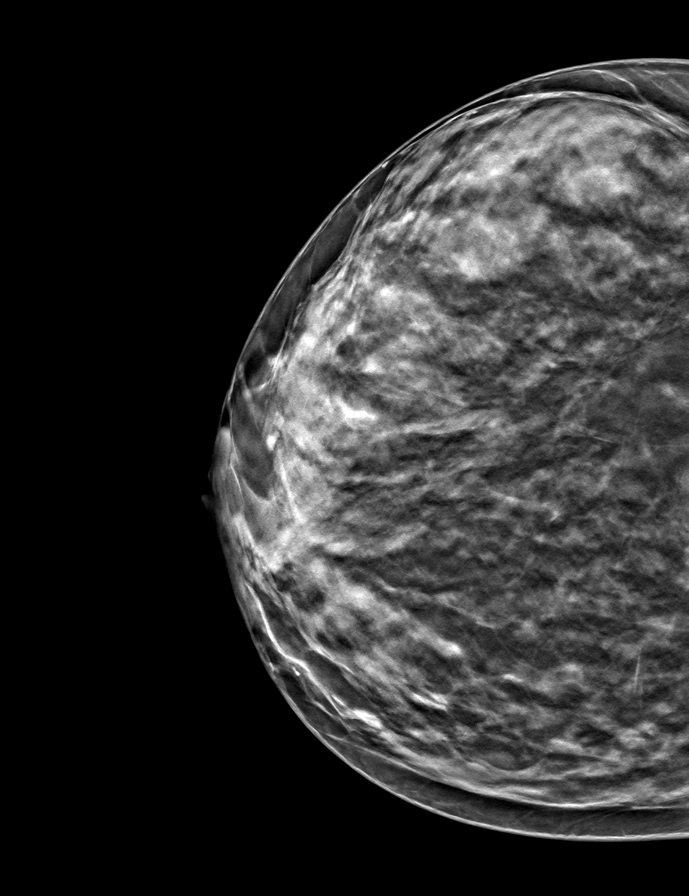

[8 of 24 positions shown; findings below may reference images not displayed]

ACR Breast Density Category d: The breast tissue is extremely dense,
which lowers the sensitivity of mammography.
FINDINGS: Tomosynthesis and synthesized full field CC and MLO views of both
breasts were obtained.

A partially obscured low-density mass whose visible margins are
circumscribed is identified in the OUTER LEFT breast in the area of
prior palpable concern. There is no associated architectural
distortion or suspicious calcifications. No suspicious findings
elsewhere in the LEFT breast.

No findings suspicious for malignancy in the RIGHT breast.

Mammographic images were processed with CAD.

On correlative physical exam, there is a freely mobile palpable
approximate 1-2 cm lump in the OUTER LEFT breast at the site where
the patient had the prior palpable concern.

Targeted LEFT breast ultrasound is performed, showing a collapsing
benign simple cyst at the 3 o'clock position approximately 4 cm from
the nipple measuring approximately 1.0 x 0.9 x 1.2 cm, demonstrating
posterior acoustic enhancement and no internal power Doppler flow,
corresponding to the palpable concern. No suspicious solid mass or
abnormal acoustic shadowing is identified.
IMPRESSION: 1. No mammographic or sonographic evidence of malignancy involving
the LEFT breast.
2. No mammographic evidence of malignancy involving the RIGHT
breast.
3. Benign collapsing simple cyst in the OUTER LEFT breast which
accounts for the palpable concern.

RECOMMENDATION:
Screening mammogram in one year.(Code:[id number]-E-I39)

I have discussed the findings and recommendations with the patient.
If applicable, a reminder letter will be sent to the patient
regarding the next appointment.

BI-RADS CATEGORY  2: Benign.

## 2021-12-24 ENCOUNTER — Telehealth: Payer: Self-pay

## 2021-12-24 NOTE — Telephone Encounter (Signed)
Pt reports dysuria and frequency. Appt scheduled tomorrow.

## 2021-12-25 ENCOUNTER — Ambulatory Visit (INDEPENDENT_AMBULATORY_CARE_PROVIDER_SITE_OTHER): Payer: Medicaid Other | Admitting: *Deleted

## 2021-12-25 VITALS — BP 142/93 | HR 88

## 2021-12-25 DIAGNOSIS — R300 Dysuria: Secondary | ICD-10-CM | POA: Diagnosis not present

## 2021-12-25 LAB — POCT URINALYSIS DIPSTICK
Bilirubin, UA: NEGATIVE
Glucose, UA: NEGATIVE
Ketones, UA: NEGATIVE
Nitrite, UA: NEGATIVE
Protein, UA: POSITIVE — AB
Spec Grav, UA: 1.015 (ref 1.010–1.025)
Urobilinogen, UA: 0.2 E.U./dL
pH, UA: 5.5 (ref 5.0–8.0)

## 2021-12-25 MED ORDER — PHENAZOPYRIDINE HCL 200 MG PO TABS: 200.0000 mg | ORAL_TABLET | Freq: Three times a day (TID) | ORAL | 1 refills | Status: AC | PRN

## 2021-12-25 MED ORDER — CEPHALEXIN 500 MG PO CAPS: 500.0000 mg | ORAL_CAPSULE | Freq: Three times a day (TID) | ORAL | 0 refills | Status: DC

## 2021-12-25 NOTE — Progress Notes (Signed)
Agree with nurses's documentation of this patient's clinic encounter.  Kaevon Cotta L, MD  

## 2021-12-25 NOTE — Progress Notes (Signed)
SUBJECTIVE: Sonya Butler is a 46 y.o. female who complains of urinary frequency, urgency and dysuria x 3 days, without flank pain, fever, chills, or abnormal vaginal discharge or bleeding.   OBJECTIVE: Appears well, in no apparent distress.  Vital signs are normal. Urine dipstick shows positive for RBC's and positive for leukocytes.    ASSESSMENT: Dysuria  PLAN: Treatment per orders.  Call or return to clinic prn if these symptoms worsen or fail to improve as anticipated.

## 2021-12-28 LAB — URINE CULTURE

## 2023-02-24 ENCOUNTER — Ambulatory Visit (INDEPENDENT_AMBULATORY_CARE_PROVIDER_SITE_OTHER): Payer: Medicaid Other | Admitting: Obstetrics and Gynecology

## 2023-02-24 ENCOUNTER — Encounter: Payer: Self-pay | Admitting: Obstetrics and Gynecology

## 2023-02-24 VITALS — BP 124/85 | HR 73 | Ht 60.0 in | Wt 157.0 lb

## 2023-02-24 DIAGNOSIS — Z01419 Encounter for gynecological examination (general) (routine) without abnormal findings: Secondary | ICD-10-CM | POA: Diagnosis not present

## 2023-02-24 DIAGNOSIS — Z30011 Encounter for initial prescription of contraceptive pills: Secondary | ICD-10-CM | POA: Diagnosis not present

## 2023-02-24 MED ORDER — IBUPROFEN 800 MG PO TABS: 800.0000 mg | ORAL_TABLET | Freq: Three times a day (TID) | ORAL | 1 refills | Status: AC | PRN

## 2023-02-24 MED ORDER — SLYND 4 MG PO TABS: 1.0000 | ORAL_TABLET | Freq: Every day | ORAL | 11 refills | Status: AC

## 2023-02-24 NOTE — Progress Notes (Signed)
 Pt doing well with no complaints. Pt does need mammo and refill on BC pill.

## 2023-02-24 NOTE — Progress Notes (Signed)
 Subjective:     Sonya Butler is a 48 y.o. female P2 with LMP 01/27/2023 and BMI 30 who is here for a comprehensive physical exam. The patient reports no problems. She reports a monthly 2-3 day period associated with dysmenorrhea relieved by ibuprofen . She is sexually active without complaints. She denies pelvic pain or abnormal discharge. She denies urinary incontinence or issues with bowel movements. Patient desires refill on POP  Past Medical History:  Diagnosis Date   Allergy    seasonal   Hailey-hailey disease    skin disease   Medical history non-contributory    Past Surgical History:  Procedure Laterality Date   CHOLECYSTECTOMY     DILATION AND CURETTAGE OF UTERUS     Family History  Problem Relation Age of Onset   Diabetes Mother    Hypertension Mother    Thyroid disease Mother    Colon cancer Father    Cancer Father    Breast cancer Paternal Grandmother    Esophageal cancer Neg Hx    Rectal cancer Neg Hx    Stomach cancer Neg Hx    Crohn's disease Neg Hx     Social History   Socioeconomic History   Marital status: Single    Spouse name: Not on file   Number of children: Not on file   Years of education: Not on file   Highest education level: Not on file  Occupational History   Not on file  Tobacco Use   Smoking status: Never    Passive exposure: Current (a little bit,not smoke,my son smokes)   Smokeless tobacco: Never  Vaping Use   Vaping status: Never Used  Substance and Sexual Activity   Alcohol use: Yes    Alcohol/week: 0.0 standard drinks of alcohol    Comment: social   Drug use: No   Sexual activity: Yes    Partners: Male    Birth control/protection: Pill  Other Topics Concern   Not on file  Social History Narrative   Not on file   Social Drivers of Health   Financial Resource Strain: Not on file  Food Insecurity: Not on file  Transportation Needs: Not on file  Physical Activity: Not on file  Stress: Not on file  Social Connections:  Not on file  Intimate Partner Violence: Not on file   Health Maintenance  Topic Date Due   DTaP/Tdap/Td (1 - Tdap) Never done   INFLUENZA VACCINE  Never done   COVID-19 Vaccine (1 - 2024-25 season) Never done   Cervical Cancer Screening (HPV/Pap Cotest)  04/27/2026   Colonoscopy  05/31/2026   Hepatitis C Screening  Completed   HIV Screening  Completed   HPV VACCINES  Aged Out       Review of Systems Pertinent items noted in HPI and remainder of comprehensive ROS otherwise negative.   Objective:  Blood pressure 124/85, pulse 73, height 5' (1.524 m), weight 157 lb (71.2 kg), last menstrual period 01/27/2023.   GENERAL: Well-developed, well-nourished female in no acute distress.  HEENT: Normocephalic, atraumatic. Sclerae anicteric.  NECK: Supple. Normal thyroid.  LUNGS: Clear to auscultation bilaterally.  HEART: Regular rate and rhythm. BREASTS: Symmetric in size. No palpable masses or lymphadenopathy, skin changes, or nipple drainage. ABDOMEN: Soft, nontender, nondistended. No organomegaly. PELVIC: Normal external female genitalia. Vagina is pink and rugated.  Normal discharge. Normal appearing cervix. Uterus is normal in size. No adnexal mass or tenderness. Chaperone present during the pelvic exam EXTREMITIES: No cyanosis, clubbing, or edema,  2+ distal pulses.     Assessment:    Healthy female exam.      Plan:    Patient with normal pap smear 2023 Screening mammogram ordered Health maintenance labs ordered Refill on Slynd  provided Patient will be contacted with abnormal results Patient is current on colonoscopy See After Visit Summary for Counseling Recommendations

## 2023-02-25 LAB — HEMOGLOBIN A1C
Est. average glucose Bld gHb Est-mCnc: 117 mg/dL
Hgb A1c MFr Bld: 5.7 % — ABNORMAL HIGH (ref 4.8–5.6)

## 2023-02-25 LAB — CBC
Hematocrit: 39.4 % (ref 34.0–46.6)
Hemoglobin: 12.9 g/dL (ref 11.1–15.9)
MCH: 27.1 pg (ref 26.6–33.0)
MCHC: 32.7 g/dL (ref 31.5–35.7)
MCV: 83 fL (ref 79–97)
Platelets: 222 10*3/uL (ref 150–450)
RBC: 4.76 x10E6/uL (ref 3.77–5.28)
RDW: 12.9 % (ref 11.7–15.4)
WBC: 3.1 10*3/uL — ABNORMAL LOW (ref 3.4–10.8)

## 2023-02-25 LAB — COMPREHENSIVE METABOLIC PANEL
ALT: 12 [IU]/L (ref 0–32)
AST: 15 [IU]/L (ref 0–40)
Albumin: 4.4 g/dL (ref 3.9–4.9)
Alkaline Phosphatase: 72 [IU]/L (ref 44–121)
BUN/Creatinine Ratio: 7 — ABNORMAL LOW (ref 9–23)
BUN: 5 mg/dL — ABNORMAL LOW (ref 6–24)
Bilirubin Total: 0.6 mg/dL (ref 0.0–1.2)
CO2: 24 mmol/L (ref 20–29)
Calcium: 9.3 mg/dL (ref 8.7–10.2)
Chloride: 100 mmol/L (ref 96–106)
Creatinine, Ser: 0.76 mg/dL (ref 0.57–1.00)
Globulin, Total: 2.3 g/dL (ref 1.5–4.5)
Glucose: 89 mg/dL (ref 70–99)
Potassium: 4 mmol/L (ref 3.5–5.2)
Sodium: 140 mmol/L (ref 134–144)
Total Protein: 6.7 g/dL (ref 6.0–8.5)
eGFR: 97 mL/min/{1.73_m2} (ref >59)

## 2023-02-25 LAB — LIPID PANEL
Chol/HDL Ratio: 3.2 {ratio} (ref 0.0–4.4)
Cholesterol, Total: 192 mg/dL (ref 100–199)
HDL: 60 mg/dL (ref >39)
LDL Chol Calc (NIH): 122 mg/dL — ABNORMAL HIGH (ref 0–99)
Triglycerides: 54 mg/dL (ref 0–149)
VLDL Cholesterol Cal: 10 mg/dL (ref 5–40)

## 2023-02-25 LAB — TSH: TSH: 0.99 u[IU]/mL (ref 0.450–4.500)

## 2023-07-16 ENCOUNTER — Other Ambulatory Visit: Payer: Self-pay | Admitting: Obstetrics and Gynecology
